# Patient Record
Sex: Male | Born: 1990 | Race: Black or African American | Hispanic: No | Marital: Single | Smoking: Current every day smoker
Health system: Southern US, Community
[De-identification: ages and names within clinical notes are randomized; demographics above are authoritative.]

---
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
## 2011-09-21 LAB — STREP A DNA PROBE

## 2011-09-21 LAB — RAPID STREP SCREEN (MED CTR MEBANE ONLY): Streptococcus, Group A Screen (Direct): NEGATIVE

## 2014-04-03 ENCOUNTER — Encounter (HOSPITAL_COMMUNITY): Payer: Self-pay | Admitting: Emergency Medicine

## 2014-04-03 ENCOUNTER — Emergency Department (HOSPITAL_COMMUNITY)
Admission: EM | Admit: 2014-04-03 | Discharge: 2014-04-03 | Disposition: A | Discharge status: Home / Self Care | Payer: No Typology Code available for payment source | Attending: Emergency Medicine | Admitting: Emergency Medicine

## 2014-04-03 ENCOUNTER — Emergency Department (HOSPITAL_COMMUNITY): Payer: No Typology Code available for payment source

## 2014-04-03 DIAGNOSIS — F17200 Nicotine dependence, unspecified, uncomplicated: Secondary | ICD-10-CM | POA: Insufficient documentation

## 2014-04-03 DIAGNOSIS — S0993XA Unspecified injury of face, initial encounter: Secondary | ICD-10-CM | POA: Insufficient documentation

## 2014-04-03 DIAGNOSIS — S199XXA Unspecified injury of neck, initial encounter: Secondary | ICD-10-CM

## 2014-04-03 DIAGNOSIS — Y9389 Activity, other specified: Secondary | ICD-10-CM | POA: Insufficient documentation

## 2014-04-03 DIAGNOSIS — Y92410 Unspecified street and highway as the place of occurrence of the external cause: Secondary | ICD-10-CM | POA: Insufficient documentation

## 2014-04-03 DIAGNOSIS — S0990XA Unspecified injury of head, initial encounter: Secondary | ICD-10-CM | POA: Insufficient documentation
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
## 2014-04-04 ENCOUNTER — Emergency Department (HOSPITAL_COMMUNITY)
Admission: EM | Admit: 2014-04-04 | Discharge: 2014-04-04 | Disposition: A | Discharge status: Home / Self Care | Payer: No Typology Code available for payment source | Attending: Emergency Medicine | Admitting: Emergency Medicine

## 2014-04-04 ENCOUNTER — Encounter (HOSPITAL_COMMUNITY): Payer: Self-pay | Admitting: Emergency Medicine

## 2014-04-04 DIAGNOSIS — G8911 Acute pain due to trauma: Secondary | ICD-10-CM | POA: Insufficient documentation

## 2014-04-04 DIAGNOSIS — F17200 Nicotine dependence, unspecified, uncomplicated: Secondary | ICD-10-CM | POA: Insufficient documentation

## 2014-04-04 DIAGNOSIS — M62838 Other muscle spasm: Secondary | ICD-10-CM | POA: Insufficient documentation

## 2014-04-04 MED ORDER — CYCLOBENZAPRINE HCL 10 MG PO TABS: 10.0000 mg | ORAL_TABLET | Freq: Two times a day (BID) | ORAL | Status: DC | PRN
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
## 2014-04-04 NOTE — Discharge Instructions (Signed)
Apply ice to the area, continue to take ibuprofen. Take the muscle relaxant as directed. Do not take the muscle relaxant if you are driving as it will make you sleepy. Return as needed.
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
## 2014-04-05 NOTE — ED Provider Notes (Signed)
Medical screening examination/treatment/procedure(s) were performed by non-physician practitioner and as supervising physician I was immediately available for consultation/collaboration.   EKG Interpretation None     Medical screening examination/treatment/procedure(s) were performed by non-physician practitioner and as supervising physician I was immediately available for consultation/collaboration.   EKG Interpretation None       Donnetta HutchingBrian Allye Hoyos, MD 04/05/14 2143

## 2016-03-05 ENCOUNTER — Encounter (HOSPITAL_COMMUNITY): Payer: Self-pay | Admitting: *Deleted

## 2016-03-05 ENCOUNTER — Emergency Department (HOSPITAL_COMMUNITY)
Admission: EM | Admit: 2016-03-05 | Discharge: 2016-03-05 | Disposition: A | Discharge status: Home / Self Care | Payer: No Typology Code available for payment source | Attending: Emergency Medicine | Admitting: Emergency Medicine

## 2016-03-05 DIAGNOSIS — R1013 Epigastric pain: Secondary | ICD-10-CM | POA: Insufficient documentation

## 2016-03-05 DIAGNOSIS — F17210 Nicotine dependence, cigarettes, uncomplicated: Secondary | ICD-10-CM | POA: Insufficient documentation

## 2016-03-05 DIAGNOSIS — R110 Nausea: Secondary | ICD-10-CM | POA: Insufficient documentation

## 2016-03-05 LAB — COMPREHENSIVE METABOLIC PANEL
ALK PHOS: 58 U/L (ref 38–126)
ALT: 19 U/L (ref 17–63)
ANION GAP: 9 (ref 5–15)
AST: 25 U/L (ref 15–41)
Albumin: 3.9 g/dL (ref 3.5–5.0)
BILIRUBIN TOTAL: 1.2 mg/dL (ref 0.3–1.2)
BUN: 17 mg/dL (ref 6–20)
CALCIUM: 9.2 mg/dL (ref 8.9–10.3)
CO2: 25 mmol/L (ref 22–32)
Chloride: 105 mmol/L (ref 101–111)
Creatinine, Ser: 1.29 mg/dL — ABNORMAL HIGH (ref 0.61–1.24)
GFR calc non Af Amer: >60 mL/min (ref >60)
Glucose, Bld: 111 mg/dL — ABNORMAL HIGH (ref 65–99)
Potassium: 3.8 mmol/L (ref 3.5–5.1)
SODIUM: 139 mmol/L (ref 135–145)
TOTAL PROTEIN: 7.2 g/dL (ref 6.5–8.1)

## 2016-03-05 LAB — CBC
HCT: 41.4 % (ref 39.0–52.0)
HEMOGLOBIN: 14.6 g/dL (ref 13.0–17.0)
MCH: 30.5 pg (ref 26.0–34.0)
MCHC: 35.3 g/dL (ref 30.0–36.0)
MCV: 86.6 fL (ref 78.0–100.0)
Platelets: 202 10*3/uL (ref 150–400)
RBC: 4.78 MIL/uL (ref 4.22–5.81)
RDW: 13.5 % (ref 11.5–15.5)
WBC: 3.7 10*3/uL — AB (ref 4.0–10.5)

## 2016-03-05 LAB — LIPASE, BLOOD: Lipase: 25 U/L (ref 11–51)

## 2016-03-05 MED ORDER — ONDANSETRON 4 MG PO TBDP
4.0000 mg | ORAL_TABLET | Freq: Once | ORAL | Status: AC
  Administered 2016-03-05: 4 mg via ORAL
  Filled 2016-03-05: qty 1

## 2016-03-05 NOTE — ED Notes (Signed)
Pt reports onset yesterday of mid abd pain and cramping. Having nausea, denies vomiting or diarrhea.

## 2016-03-05 NOTE — ED Provider Notes (Signed)
CSN: [id number]     Arrival date & time 03/05/16  0957 History   First MD Initiated Contact with Patient 03/05/16 1009     Chief Complaint  Patient presents with  . Abdominal Pain  . Nausea     (Consider location/radiation/quality/duration/timing/severity/associated sxs/prior Treatment) Patient is a 25 y.o. male presenting with abdominal pain. The history is provided by the patient.  Abdominal Pain Pain location:  Epigastric Pain quality: cramping   Pain radiates to:  Does not radiate Pain severity:  Moderate Onset quality:  Gradual Duration:  2 days Timing:  Constant Progression:  Unchanged Chronicity:  New Context: not suspicious food intake and not trauma   Relieved by:  Nothing Worsened by:  Nothing tried (eating does not make pain worse) Associated symptoms: nausea   Associated symptoms: no chest pain, no chills, no constipation, no diarrhea, no dysuria, no fever, no hematemesis, no hematuria, no shortness of breath, no sore throat, no vaginal bleeding, no vaginal discharge and no vomiting     History reviewed. No pertinent past medical history. History reviewed. No pertinent past surgical history. History reviewed. No pertinent family history. Social History  Substance Use Topics  . Smoking status: Current Every Day Smoker -- 0.50 packs/day    Types: Cigarettes  . Smokeless tobacco: None  . Alcohol Use: No    Review of Systems  Constitutional: Negative for fever, chills, diaphoresis, activity change and appetite change.  HENT: Negative for facial swelling, sore throat, tinnitus, trouble swallowing and voice change.   Eyes: Negative for pain, redness and visual disturbance.  Respiratory: Negative for chest tightness, shortness of breath and wheezing.   Cardiovascular: Negative for chest pain, palpitations and leg swelling.  Gastrointestinal: Positive for nausea and abdominal pain. Negative for vomiting, diarrhea, constipation, abdominal distention and hematemesis.   Endocrine: Negative.   Genitourinary: Negative.  Negative for dysuria, hematuria, decreased urine volume, scrotal swelling, vaginal bleeding, vaginal discharge and testicular pain.  Musculoskeletal: Negative for myalgias, back pain and gait problem.  Skin: Negative.  Negative for rash.  Neurological: Negative.  Negative for dizziness, tremors, weakness and headaches.  Psychiatric/Behavioral: Negative for suicidal ideas, hallucinations and self-injury. The patient is not nervous/anxious.       Allergies  Review of patient's allergies indicates no known allergies.  Home Medications   Prior to Admission medications   Medication Sig Start Date End Date Taking? Authorizing Provider  cyclobenzaprine (FLEXERIL) 10 MG tablet Take 1 tablet (10 mg total) by mouth 2 (two) times daily as needed for muscle spasms. 04/04/14   Hope Orlene Och, NP   BP 135/116 mmHg  Pulse 98  Temp(Src) 97.9 F (36.6 C) (Oral)  Resp 14  SpO2 98% Physical Exam  Constitutional: He is oriented to person, place, and time. He appears well-developed and well-nourished. No distress.  HENT:  Head: Normocephalic and atraumatic.  Right Ear: External ear normal.  Left Ear: External ear normal.  Nose: Nose normal.  Mouth/Throat: Oropharynx is clear and moist.  Eyes: Conjunctivae and EOM are normal. Pupils are equal, round, and reactive to light. No scleral icterus.  Neck: Normal range of motion. Neck supple. No JVD present. No tracheal deviation present. No thyromegaly present.  Cardiovascular: Normal rate and intact distal pulses.  Exam reveals no gallop and no friction rub.   No murmur heard. Pulmonary/Chest: Effort normal and breath sounds normal. No stridor. No respiratory distress. He has no wheezes. He has no rales.  Abdominal: Soft. He exhibits no distension. There is no tenderness (  no abdominal tenderness (pain level does not increase from baseline during palpation of abdomen)). There is no rebound and no guarding.   Musculoskeletal: Normal range of motion. He exhibits no edema or tenderness.  Neurological: He is alert and oriented to person, place, and time. No cranial nerve deficit. He exhibits normal muscle tone. Coordination normal.  5/5 strength in all 4 extremities. Normal Gait.   Skin: Skin is warm and dry. No rash noted. He is not diaphoretic.  Psychiatric: He has a normal mood and affect. His behavior is normal.  Nursing note and vitals reviewed.   ED Course  Procedures (including critical care time) Labs Review Labs Reviewed  COMPREHENSIVE METABOLIC PANEL - Abnormal; Notable for the following:    Glucose, Bld 111 (*)    Creatinine, Ser 1.29 (*)    All other components within normal limits  CBC - Abnormal; Notable for the following:    WBC 3.7 (*)    All other components within normal limits  LIPASE, BLOOD    Imaging Review No results found. I have personally reviewed and evaluated these images and lab results as part of my medical decision-making.   EKG Interpretation None      MDM   Final diagnoses:  Epigastric abdominal pain    The patient is a previous healthy 25 year old male who presents with 2 days of epigastric cramping abdominal pain. No fevers and patient's able tolerate by mouth at home. Physical exam benign as above. Laboratory evaluation shows mildly elevated creatinine concerning for pre-renal AKI but is otherwise unremarkable. Do not suspect pancreatitis, abdominal abscess, peritonitis or systemic infection. This patient is able tolerate by mouth feel patient is able to rehydrate orally. Patient advised to drink plenty of hydrating fluids and follow-up with primary care doctor. Standard ED return precautions given. Patient expresses understanding and agreement with this plan.  I estimate there is LOW risk for ACUTE APPENDICITIS, BOWEL OBSTRUCTION, CHOLECYSTITIS, DIVERTICULITIS, INCARCERATED HERNIA, MESENTERIC ISCHEMIA, PANCREATITIS, or PERFORATED BOWEL or ULCER,  thus I consider the discharge disposition reasonable. Also, there is no evidence or peritonitis, sepsis or toxicity. We have discussed the diagnosis and risks and agree with discharging home to follow-up with their primary doctor. We also discussed returning to the Emergency Department immediately if new or worsening symptoms occur and have discussed the symptoms which are most concerning (e.g., bloody stool, fever, changing or worsening pain, vomiting) that necessitate immediate return.  Patient seen with attending, Dr. Lynelle DoctorKnapp, who oversaw clinical decision making.     Lula OlszewskiMike Iris Hairston, MD 03/05/16 1117  Linwood DibblesJon Knapp, MD 03/05/16 [phone]

## 2016-03-05 NOTE — Discharge Instructions (Signed)

## 2016-10-17 ENCOUNTER — Encounter (HOSPITAL_COMMUNITY): Payer: Self-pay | Admitting: Emergency Medicine

## 2016-10-17 ENCOUNTER — Emergency Department (HOSPITAL_COMMUNITY)
Admission: EM | Admit: 2016-10-17 | Discharge: 2016-10-17 | Disposition: A | Discharge status: Home / Self Care | Payer: Self-pay | Attending: Emergency Medicine | Admitting: Emergency Medicine

## 2016-10-17 DIAGNOSIS — F17210 Nicotine dependence, cigarettes, uncomplicated: Secondary | ICD-10-CM | POA: Insufficient documentation

## 2016-10-17 DIAGNOSIS — R42 Dizziness and giddiness: Secondary | ICD-10-CM | POA: Insufficient documentation

## 2016-10-17 DIAGNOSIS — J069 Acute upper respiratory infection, unspecified: Secondary | ICD-10-CM | POA: Insufficient documentation

## 2016-10-17 NOTE — ED Triage Notes (Signed)
Pt to ER by private vehicle with complaint of intermittent dizziness onset after being sick with URI for 2 days. Pt reports he had minimal appetite while sick. Pt reports worse with standing and walking. VSS at this time. A/o x4.

## 2016-10-17 NOTE — ED Notes (Signed)
Pt refused wheelchair at discharge. NAD. Ambulatory without dizziness at this time. Steady gait. Verbalizes understanding of discharge instructions.

## 2016-10-17 NOTE — ED Provider Notes (Signed)
[facility]-EMERGENCY DEPT Provider Note   CSN: [id number] Arrival date & time: 10/17/16  [id number]     History   Chief Complaint Chief Complaint  Patient presents with  . Dizziness    HPI Chris Gibson is a 25 y.o. male.  HPI Patient reports he had cold symptoms on Monday. He had a lot of nasal congestion and body aches. He thinks he had a fever that day. He continued however to feel dizzy if he turned his head quickly or bends over. 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 No nausea, vomiting or diarrhea. No skin rashes. No headache. History reviewed. No pertinent past medical history.  There are no active problems to display for this patient.   History reviewed. No pertinent surgical history.     Home Medications    Prior to Admission medications   Medication Sig Start Date End Date Taking? Authorizing Provider  cyclobenzaprine (FLEXERIL) 10 MG tablet Take 1 tablet (10 mg total) by mouth 2 (two) times daily as needed for muscle spasms. 04/04/14   Hope Orlene OchM Neese, NP    Family History History reviewed. No pertinent family history.  Social History Social History  Substance Use Topics  . Smoking status: Current Every Day Smoker    Packs/day: 0.50    Types: Cigarettes  . Smokeless tobacco: Never Used  . Alcohol use No     Allergies   Patient has no known allergies.   Review of Systems Review of Systems 10 Systems reviewed and are negative for acute change except as noted in the HPI.  Physical Exam Updated Vital Signs BP 111/78   Pulse 72   Temp 97.5 F (36.4 C) (Oral)   Resp 19   SpO2 99%   Physical Exam  Constitutional: He is oriented to person, place, and time. He appears well-developed and well-nourished.  HENT:  Head: Normocephalic and atraumatic.  Nose: Nose normal.  Mouth/Throat: Oropharynx is clear and moist.  Cerumen  bilateral ear canals. Partial visualization of TMs does not show erythema or purulent appearance.  Eyes: Conjunctivae and EOM are normal. Pupils are equal, round, and reactive to light.  Neck: Neck supple.  Cardiovascular: Normal rate and regular rhythm.   No murmur heard. Pulmonary/Chest: Effort normal and breath sounds normal. No respiratory distress.  Abdominal: Soft. There is no tenderness.  Musculoskeletal: He exhibits no edema or tenderness.  Neurological: He is alert and oriented to person, place, and time. No cranial nerve deficit. He exhibits normal muscle tone. Coordination normal.  Skin: Skin is warm and dry.  Psychiatric: He has a normal mood and affect.  Nursing note and vitals reviewed.    ED Treatments / Results  Labs (all labs ordered are listed, but only abnormal results are displayed) Labs Reviewed - No data to display  EKG  EKG Interpretation  Date/Time:  Wednesday October 17 2016 08:43:55 EST Ventricular Rate:  71 PR Interval:    QRS Duration: 96 QT Interval:  369 QTC Calculation: 401 R Axis:   99 Text Interpretation:  Sinus rhythm Confirmed by Advanced Surgery Center Of Clif[facility] LLCALUMBO-RASCH  MD, APRIL ([id number]) on 10/18/2016 10:11:02 AM       Radiology No results found.  Procedures Procedures (including critical care time)  Medications Ordered in ED Medications - No data to display   Initial Impression / Assessment and Plan / ED Course  I have  reviewed the triage vital signs and the nursing notes.  Pertinent labs & imaging results that were available during my care of the patient were reviewed by me and considered in my medical decision making (see chart for details).  Clinical Course       Final Clinical Impressions(s) / ED Diagnoses   Final diagnoses:  Dizziness  Upper respiratory tract infection, unspecified type   Patient clinically well in appearance. Plan for symptomatic treatment for URI symptoms. Return precautions reviewed. New Prescriptions Discharge  Medication List as of 10/17/2016  9:12 AM       Arby BarretteMarcy Oyinkansola Truax, MD 10/23/16 1427

## 2016-10-17 NOTE — Discharge Instructions (Signed)
You may take an over- the-counter product such as Tylenol Severe cold and sinus medication.

## 2016-10-17 NOTE — ED Notes (Signed)
Brought patient back to room; Onalee Huaavid, EMT present in room; patient undressed, in gown, on monitor, continuous pulse oximetry and blood pressure cuff; vitals and EKG performed

## 2017-08-09 ENCOUNTER — Encounter (HOSPITAL_COMMUNITY): Payer: Self-pay | Admitting: *Deleted

## 2017-08-09 ENCOUNTER — Emergency Department (HOSPITAL_COMMUNITY)
Admission: EM | Admit: 2017-08-09 | Discharge: 2017-08-09 | Disposition: A | Discharge status: Home / Self Care | Payer: Self-pay | Attending: Emergency Medicine | Admitting: Emergency Medicine

## 2017-08-09 DIAGNOSIS — R69 Illness, unspecified: Secondary | ICD-10-CM

## 2017-08-09 DIAGNOSIS — J039 Acute tonsillitis, unspecified: Secondary | ICD-10-CM | POA: Insufficient documentation

## 2017-08-09 DIAGNOSIS — J111 Influenza due to unidentified influenza virus with other respiratory manifestations: Secondary | ICD-10-CM

## 2017-08-09 DIAGNOSIS — F17210 Nicotine dependence, cigarettes, uncomplicated: Secondary | ICD-10-CM | POA: Insufficient documentation

## 2017-08-09 DIAGNOSIS — M545 Low back pain: Secondary | ICD-10-CM | POA: Insufficient documentation

## 2017-08-09 LAB — RAPID STREP SCREEN (MED CTR MEBANE ONLY): Streptococcus, Group A Screen (Direct): NEGATIVE

## 2017-08-09 MED ORDER — IBUPROFEN 800 MG PO TABS: 800.0000 mg | ORAL_TABLET | Freq: Three times a day (TID) | ORAL | 0 refills | Status: DC

## 2017-08-09 MED ORDER — TRAMADOL HCL 50 MG PO TABS
50.0000 mg | ORAL_TABLET | Freq: Once | ORAL | Status: AC
  Administered 2017-08-09: 50 mg via ORAL
  Filled 2017-08-09: qty 1

## 2017-08-09 MED ORDER — ACETAMINOPHEN 325 MG PO TABS
650.0000 mg | ORAL_TABLET | Freq: Once | ORAL | Status: AC | PRN
  Administered 2017-08-09: 650 mg via ORAL

## 2017-08-09 MED ORDER — AMOXICILLIN 500 MG PO CAPS: 500.0000 mg | ORAL_CAPSULE | Freq: Three times a day (TID) | ORAL | 0 refills | Status: DC

## 2017-08-09 MED ORDER — ACETAMINOPHEN 325 MG PO TABS
ORAL_TABLET | ORAL | Status: AC
  Filled 2017-08-09: qty 2

## 2017-08-09 NOTE — ED Triage Notes (Signed)
Pt reports that he has the flu. Reports bodyaches, chills, headache, sore throat, nausea since Tuesday. Mask on pt at triage.

## 2017-08-09 NOTE — ED Provider Notes (Signed)
[facility]PT Provider Note   CSN: [id number] Arrival date & time: 08/09/17  [id number]  History   Chief Complaint Chief Complaint  Patient presents with  . Influenza    HPI Chris Gibson is a 26 y.o. male.  HPI   Patient comes to the ER with complaints of URI symptoms since this past Tuesday. He has been having fevers, low back pain, body aches, sore throat, headache and nausea. He has not tried taking any medications at home prior to arrival. He has had strep throat before and this feels similar. He has not had any rash, dysuria, coughing, syncope, severe headaches, abdominal pain, weight loss or abnormal bleeding. Temperature 102.8 on arrival, given protocol tylenol  History reviewed. No pertinent past medical history.  There are no active problems to display for this patient.   History reviewed. No pertinent surgical history.     Home Medications    Prior to Admission medications   Medication Sig Start Date End Date Taking? Authorizing Provider  cyclobenzaprine (FLEXERIL) 10 MG tablet Take 1 tablet (10 mg total) by mouth 2 (two) times daily as needed for muscle spasms. Patient not taking: Reported on 08/09/2017 04/04/14   Janne NapoleonNeese, Hope M, NP    Family History History reviewed. No pertinent family history.  Social History Social History  Substance Use Topics  . Smoking status: Current Every Day Smoker    Packs/day: 0.50    Types: Cigarettes  . Smokeless tobacco: Never Used  . Alcohol use No     Allergies   Patient has no known allergies.   Review of Systems Review of Systems ROS: No TIA's or unusual headaches, no dysphagia.  No prolonged cough. No dyspnea or chest pain on exertion.  No abdominal pain, change in bowel habits, black or bloody stools.  No urinary tract symptoms.  No new or unusual musculoskeletal symptoms.  Normal menses, no abnormal vaginal bleeding, discharge or unexpected pelvic pain. No new breast lumps, breast pain or nipple  discharge.  Physical Exam Updated Vital Signs BP 128/79   Pulse 95   Temp 99.2 F (37.3 C) (Oral)   Resp 18   SpO2 97%   Physical Exam  Constitutional: He is oriented to person, place, and time. He appears well-developed and well-nourished. No distress.  HENT:  Head: Normocephalic and atraumatic.  Right Ear: Tympanic membrane, external ear and ear canal normal.  Left Ear: Tympanic membrane, external ear and ear canal normal.  Nose: Nose normal. No rhinorrhea. Right sinus exhibits no maxillary sinus tenderness and no frontal sinus tenderness. Left sinus exhibits no maxillary sinus tenderness and no frontal sinus tenderness.  Mouth/Throat: Uvula is midline and mucous membranes are normal. No trismus in the jaw. Normal dentition. No dental abscesses or uvula swelling. Oropharyngeal exudate and posterior oropharyngeal edema present. No posterior oropharyngeal erythema or tonsillar abscesses.  No submental edema, tongue not elevated, no trismus. No impending airway obstruction; Pt able to speak full sentences, swallow intact, no drooling, stridor, or tonsillar/uvula displacement. No palatal petechia  Eyes: Conjunctivae are normal.  Neck: Trachea normal, normal range of motion and full passive range of motion without pain. Neck supple. No neck rigidity. Normal range of motion present. No Brudzinski's sign noted.  Flexion and extension of neck without pain or difficulty. Able to breath without difficulty in extension.  Cardiovascular: Normal rate and regular rhythm.   Pulmonary/Chest: Effort normal and breath sounds normal. No stridor. No respiratory distress. He has no wheezes.  Abdominal: Soft. There is  no tenderness.  No obvious evidence of splenomegaly. Non ttp.   Musculoskeletal: Normal range of motion.  Lymphadenopathy:       Head (right side): No preauricular and no posterior auricular adenopathy present.       Head (left side): No preauricular and no posterior auricular adenopathy  present.    He has cervical adenopathy.  Neurological: He is alert and oriented to person, place, and time.  Skin: Skin is warm and dry. No rash noted. He is not diaphoretic.  Psychiatric: He has a normal mood and affect.  Nursing note and vitals reviewed.    ED Treatments / Results  Labs (all labs ordered are listed, but only abnormal results are displayed) Labs Reviewed  RAPID STREP SCREEN (NOT AT [facility])  CULTURE, GROUP A STREP [facility])    EKG  EKG Interpretation None       Radiology No results found.  Procedures Procedures (including critical care time)  Medications Ordered in ED Medications  acetaminophen (TYLENOL) 325 MG tablet (not administered)  acetaminophen (TYLENOL) tablet 650 mg (650 mg Oral Given 08/09/17 0736)  traMADol (ULTRAM) tablet 50 mg (50 mg Oral Given 08/09/17 1016)     Initial Impression / Assessment and Plan / ED Course  I have reviewed the triage vital signs and the nursing notes.  Pertinent labs & imaging results that were available during my care of the patient were reviewed by me and considered in my medical decision making (see chart for details).    Strep screen is negative but [name] has tonsillar exudates and erythema to his posterior pharynx. This is most likely viral. Will give Rx for Penicillin and I ask that he wait at least 3 more days before taking it to see if his symptoms improve or he can take it if his symptoms worsen. Ibuprofen is sufficient for his back pain.  He otherwise has not apparent life threatening conditions at this time. He has been given s/sx that would need a repeat examination. Such as new, changing or worsening of his symptoms. Or if his symptoms fail to resolve.  Final Clinical Impressions(s) / ED Diagnoses   Final diagnoses:  Influenza-like illness  Tonsillitis    New Prescriptions New Prescriptions   No medications on file     Marlon Pel, Cordelia Poche 08/09/17 1045    Alvira Monday, MD 08/13/17  0025

## 2017-08-11 LAB — CULTURE, GROUP A STREP (THRC)

## 2017-08-12 ENCOUNTER — Emergency Department (HOSPITAL_COMMUNITY)
Admission: EM | Admit: 2017-08-12 | Discharge: 2017-08-12 | Disposition: A | Discharge status: Home / Self Care | Payer: Self-pay | Attending: Emergency Medicine | Admitting: Emergency Medicine

## 2017-08-12 ENCOUNTER — Encounter (HOSPITAL_COMMUNITY): Payer: Self-pay

## 2017-08-12 DIAGNOSIS — F17210 Nicotine dependence, cigarettes, uncomplicated: Secondary | ICD-10-CM | POA: Insufficient documentation

## 2017-08-12 DIAGNOSIS — J029 Acute pharyngitis, unspecified: Secondary | ICD-10-CM | POA: Insufficient documentation

## 2017-08-12 DIAGNOSIS — R21 Rash and other nonspecific skin eruption: Secondary | ICD-10-CM | POA: Insufficient documentation

## 2017-08-12 MED ORDER — GI COCKTAIL ~~LOC~~
30.0000 mL | Freq: Once | ORAL | Status: AC
  Administered 2017-08-12: 30 mL via ORAL
  Filled 2017-08-12: qty 30

## 2017-08-12 MED ORDER — DEXAMETHASONE SODIUM PHOSPHATE 10 MG/ML IJ SOLN
10.0000 mg | Freq: Once | INTRAMUSCULAR | Status: AC
Start: 2017-08-12 — End: 2017-08-12
  Administered 2017-08-12: 10 mg via INTRAMUSCULAR
  Filled 2017-08-12: qty 1

## 2017-08-12 MED ORDER — KETOROLAC TROMETHAMINE 30 MG/ML IJ SOLN
30.0000 mg | Freq: Once | INTRAMUSCULAR | Status: DC
  Filled 2017-08-12: qty 1

## 2017-08-12 MED ORDER — LIDOCAINE VISCOUS 2 % MT SOLN: 20.0000 mL | OROMUCOSAL | 0 refills | Status: DC | PRN

## 2017-08-12 NOTE — ED Triage Notes (Signed)
Per Pt, Pt is coming from home with complaints of macular rash around neck that started on Saturday after starting antibiotics. Pt also complains of sputum in throat that has a red tint. Reports drinking red drink yesterday, but the red tint has continued on to today.

## 2017-08-12 NOTE — Discharge Instructions (Signed)
Doubt that the rash is due to a medication reaction. Seems consistent with possible bug bite. Warm compresses to the spot. Take benadryl to help with swelling.   Please continue taking motrin and tylenol for your sore throat. Have given you a script for viscous lidocaine. Take to help with sore throat. Would continue taking the amoxicillin. If you continue see red sputum you may follow up with your pcp or return to the ED.   If you are sore throat is not improved within the next week follow-up with the primary care doctor for further evaluation. If he developed worsening symptoms or difficulty breathing or swallowing return to the ED.

## 2017-08-12 NOTE — ED Provider Notes (Signed)
[facility]-EMERGENCY DEPT Provider Note   CSN: [id number] Arrival date & time: 08/12/17  0827     History   Chief Complaint Chief Complaint  Patient presents with  . Rash    HPI Chris Gibson is a 26 y.o. male.  HPI 26 year old African-American male with no significant past medical history presents to the ED today with possible medication reaction. The patient states that 3 days ago he was started on amoxicillin in the ED for fever, sore throat. Patient states that today he noticed 2 red bumps come up on his anterior chest. They're painful to palpation. He denies being bit by any insects.patient denies any history of antibiotic reactions. States that his fevers have improved.the patient states the rash is not pruritic in nature. Denies any oral mucosal involvement.  The patient states that his sore throat is ongoing. States that he has been taking ibuprofen and Tylenol at home with little relief. The patient states that after drinking a red drink yesterday he noticed some red streaking in his sputum. Denies any frank hemoptysis or hematocrit emesis. States that it feels like he scratched something the back of his throat. Patient denies any associated symptoms of shortness of breath, cough, nausea, vomiting, abdominal pain. Denies any otalgia, rhinorrhea, chest congestion. Denies any cough. History reviewed. No pertinent past medical history.  There are no active problems to display for this patient.   History reviewed. No pertinent surgical history.     Home Medications    Prior to Admission medications   Medication Sig Start Date End Date Taking? Authorizing Provider  amoxicillin (AMOXIL) 500 MG capsule Take 1 capsule (500 mg total) by mouth 3 (three) times daily. 08/09/17  Yes Neva SeatGreene, Tiffany, PA-C  ibuprofen (ADVIL,MOTRIN) 800 MG tablet Take 1 tablet (800 mg total) by mouth 3 (three) times daily. 08/09/17  Yes Neva SeatGreene, Tiffany, PA-C  cyclobenzaprine (FLEXERIL) 10 MG tablet Take 1  tablet (10 mg total) by mouth 2 (two) times daily as needed for muscle spasms. Patient not taking: Reported on 08/09/2017 04/04/14   Janne NapoleonNeese, Hope M, NP  lidocaine (XYLOCAINE) 2 % solution Use as directed 20 mLs in the mouth or throat as needed for mouth pain. 08/12/17   Rise MuLeaphart, Arnetta Odeh T, PA-C    Family History No family history on file.  Social History Social History  Substance Use Topics  . Smoking status: Current Every Day Smoker    Packs/day: 1.00    Types: Cigarettes  . Smokeless tobacco: Never Used  . Alcohol use Yes     Comment: Occasional      Allergies   Patient has no known allergies.   Review of Systems Review of Systems  Constitutional: Negative for chills and fever.  HENT: Positive for sore throat. Negative for congestion and rhinorrhea.   Respiratory: Negative for cough and shortness of breath.   Cardiovascular: Negative for chest pain.  Gastrointestinal: Negative for nausea and vomiting.  Skin: Positive for rash. Negative for color change.  Neurological: Negative for dizziness, syncope, weakness and light-headedness.     Physical Exam Updated Vital Signs BP 121/73 (BP Location: Right Arm)   Pulse 67   Temp 98.2 F (36.8 C) (Oral)   Resp 18   Ht 5\' 5"  (1.651 m)   Wt 65.8 kg (145 lb)   SpO2 100%   BMI 24.13 kg/m   Physical Exam  Constitutional: He appears well-developed and well-nourished. No distress.  HENT:  Head: Normocephalic and atraumatic.  Mouth/Throat: Uvula is midline and mucous  membranes are normal. No trismus in the jaw. No uvula swelling. Oropharyngeal exudate, posterior oropharyngeal edema and posterior oropharyngeal erythema present. No tonsillar abscesses. Tonsils are 2+ on the right. Tonsils are 2+ on the left. Tonsillar exudate.  o oral mucosal involvement or lesions. Oropharynx is with 2+ tonsillar edema and exudate.  Eyes: Right eye exhibits no discharge. Left eye exhibits no discharge. No scleral icterus.  Neck: Normal range of  motion. Neck supple.  Pulmonary/Chest: Effort normal and breath sounds normal. No respiratory distress.  Musculoskeletal: Normal range of motion.  Lymphadenopathy:    He has no cervical adenopathy.  Neurological: He is alert.  Skin: Skin is warm and dry. Capillary refill takes less than 2 seconds. No pallor.  Patient has 2 erythematous lesions to the anterior chest. Appears to be a possible bug bite. They're not pruritic. Mild tenderness to palpation. No area of fluctuance concerning for abscess. No spreading cellulitis. No other rash noted.  Psychiatric: His behavior is normal. Judgment and thought content normal.  Nursing note and vitals reviewed.    ED Treatments / Results  Labs (all labs ordered are listed, but only abnormal results are displayed) Labs Reviewed - No data to display  EKG  EKG Interpretation None       Radiology No results found.  Procedures Procedures (including critical care time)  Medications Ordered in ED Medications  gi cocktail (Maalox,Lidocaine,Donnatal) (30 mLs Oral Given 08/12/17 0906)  dexamethasone (DECADRON) injection 10 mg (10 mg Intramuscular Given 08/12/17 0907)     Initial Impression / Assessment and Plan / ED Course  I have reviewed the triage vital signs and the nursing notes.  Pertinent labs & imaging results that were available during my care of the patient were reviewed by me and considered in my medical decision making (see chart for details).     Patient presents to the ED with ongoing sore throat and possible  Rash due to antibiotic use. The erythematous lesions on the anterior chest do not seem consistent with a possible allergic reaction to the amoxicillin.Patient has no signs of petechiae. Oropharynx is with exudates and edema. Patient given Decadron. Uvula is midline with no trismus. Doubt deep neck infection or peritonsillar abscess. Would like patient to continue taking the amoxicillin for pharyngitis. The patient's pain and  shoulder in the ED. Patient is tolerating by mouth fluids without any difficulties. Doubt GC pharyngitis.the erythematous lesions seem consistent with possible bug bite. Encouraged Benadryl and warm compresses. No signs of drainable abscess or cellulitis at this time. Vital signs are reassuring. Patient is afebrile.  Patient reports some red streaking in his sputum. Likely from the red drink that he drank yesterday. Clinical presentation does not seem concerning for PE. Encouraged strict return precautions and follow up PCP.  Pt is hemodynamically stable, in NAD, & able to ambulate in the ED. Evaluation does not show pathology that would require ongoing emergent intervention or inpatient treatment. I explained the diagnosis to the patient. Pain has been managed & has no complaints prior to dc. Pt is comfortable with above plan and is stable for discharge at this time. All questions were answered prior to disposition. Strict return precautions for f/u to the ED were discussed. Encouraged follow up with PCP.  Dicussed with Dr. Madilyn Hook who saw patient and is agreeable to the above [plan.   Final Clinical Impressions(s) / ED Diagnoses   Final diagnoses:  Rash  Sorethroat    New Prescriptions Discharge Medication List as of 08/12/2017 10:07  AM    START taking these medications   Details  lidocaine (XYLOCAINE) 2 % solution Use as directed 20 mLs in the mouth or throat as needed for mouth pain., Starting Mon 08/12/2017, Print         Rise Mu, PA-C 08/12/17 1548    Tilden Fossa, MD 08/17/17 [phone]

## 2017-10-06 ENCOUNTER — Emergency Department (HOSPITAL_COMMUNITY)
Admission: EM | Admit: 2017-10-06 | Discharge: 2017-10-06 | Disposition: A | Discharge status: Home / Self Care | Payer: Self-pay | Attending: Emergency Medicine | Admitting: Emergency Medicine

## 2017-10-06 ENCOUNTER — Encounter (HOSPITAL_COMMUNITY): Payer: Self-pay | Admitting: Emergency Medicine

## 2017-10-06 DIAGNOSIS — Z791 Long term (current) use of non-steroidal anti-inflammatories (NSAID): Secondary | ICD-10-CM | POA: Insufficient documentation

## 2017-10-06 DIAGNOSIS — F17210 Nicotine dependence, cigarettes, uncomplicated: Secondary | ICD-10-CM | POA: Insufficient documentation

## 2017-10-06 DIAGNOSIS — L02415 Cutaneous abscess of right lower limb: Secondary | ICD-10-CM | POA: Insufficient documentation

## 2017-10-06 MED ORDER — LIDOCAINE-EPINEPHRINE (PF) 2 %-1:200000 IJ SOLN
10.0000 mL | Freq: Once | INTRAMUSCULAR | Status: AC
  Administered 2017-10-06: 10 mL via INTRADERMAL
  Filled 2017-10-06: qty 20

## 2017-10-06 NOTE — ED Triage Notes (Signed)
Pt c/o boil to right thigh area x couple days

## 2017-10-06 NOTE — Discharge Instructions (Signed)
Keep wound clean and dry. Apply warm compresses hroughout the day. Alternate 600 mg of ibuprofen and [phone] mg of Tylenol every 3 hours as needed for pain. Do not exceed 4000 mg of Tylenol daily. Return in 2 days for wound recheck.Return to emergency department for emergent changing or worsening symptoms.

## 2017-10-06 NOTE — ED Notes (Signed)
Declined W/C at D/C and was escorted to lobby by RN. 

## 2017-10-06 NOTE — ED Provider Notes (Signed)
[facility] Provider Note   CSN: [id number] Arrival date & time: 10/06/17  1708     History   Chief Complaint Chief Complaint  Patient presents with  . Abscess    HPI Chris Gibson is a 26 y.o. male who presents today with chief complaint acute onset, progressively worsening swelling and erythema to the right lateral hip for one week. Patient states the area initially began as a small bump which progressively grew in size and he has become more tender.states that he has had abscesses like this in the past but they typically resolve on their own. States that he had attempted to "pop" the lesion on his own  and states it drained a small amount of purulent material. Denies fever or chills.no aggravating or alleviating factors noted.  The history is provided by the patient.    History reviewed. No pertinent past medical history.  There are no active problems to display for this patient.   History reviewed. No pertinent surgical history.     Home Medications    Prior to Admission medications   Medication Sig Start Date End Date Taking? Authorizing Provider  amoxicillin (AMOXIL) 500 MG capsule Take 1 capsule (500 mg total) by mouth 3 (three) times daily. 08/09/17   Marlon PelGreene, Tiffany, PA-C  cyclobenzaprine (FLEXERIL) 10 MG tablet Take 1 tablet (10 mg total) by mouth 2 (two) times daily as needed for muscle spasms. Patient not taking: Reported on 08/09/2017 04/04/14   Janne NapoleonNeese, Hope M, NP  ibuprofen (ADVIL,MOTRIN) 800 MG tablet Take 1 tablet (800 mg total) by mouth 3 (three) times daily. 08/09/17   Marlon PelGreene, Tiffany, PA-C  lidocaine (XYLOCAINE) 2 % solution Use as directed 20 mLs in the mouth or throat as needed for mouth pain. 08/12/17   Rise MuLeaphart, Kenneth T, PA-C    Family History No family history on file.  Social History Social History  Substance Use Topics  . Smoking status: Current Every Day Smoker    Packs/day: 1.00    Types: Cigarettes    . Smokeless tobacco: Never Used  . Alcohol use Yes     Comment: Occasional      Allergies   Patient has no known allergies.   Review of Systems Review of Systems  Constitutional: Negative for chills and fever.  Skin: Positive for color change.     Physical Exam Updated Vital Signs BP (!) 122/91   Pulse 80   Temp 98.5 F (36.9 C) (Oral)   Resp 18   Ht 5\' 5"  (1.651 m)   Wt 61.2 kg (135 lb)   SpO2 98%   BMI 22.47 kg/m   Physical Exam  Constitutional: He appears well-developed and well-nourished. No distress.  HENT:  Head: Normocephalic and atraumatic.  Eyes: Conjunctivae are normal. Right eye exhibits no discharge. Left eye exhibits no discharge.  Neck: No JVD present. No tracheal deviation present.  Cardiovascular: Normal rate.   Pulmonary/Chest: Effort normal.  Abdominal: He exhibits no distension.  Musculoskeletal: He exhibits no edema.  Neurological: He is alert.  Skin: Skin is warm and dry. There is erythema.  3 cm area of fluctuance and tenderness to the right lateral hip overlying the ASIS. Mildly tender to palpation. There is scabbing centrally from where the patient attempted to extract pus. Here is mild erythema overlying this area.  Psychiatric: He has a normal mood and affect. His behavior is normal.  Nursing note and vitals reviewed.    ED Treatments / Results  Labs (all labs ordered are listed, but only abnormal results are displayed) Labs Reviewed - No data to display  EKG  EKG Interpretation None       Radiology No results found.  Procedures .Marland KitchenIncision and Drainage Date/Time: 10/06/2017 5:52 PM Performed by: Michela Pitcher A Authorized by: Michela Pitcher A   Consent:    Consent obtained:  Verbal   Consent given by:  Patient   Risks discussed:  Bleeding, pain, incomplete drainage and infection Location:    Type:  Abscess   Size:  3cm   Location:  Lower extremity   Lower extremity location:  Hip   Hip location:  R hip Pre-procedure  details:    Skin preparation:  Betadine Anesthesia (see MAR for exact dosages):    Anesthesia method:  Local infiltration   Local anesthetic:  Lidocaine 2% WITH epi Procedure type:    Complexity:  Simple Procedure details:    Needle aspiration: no     Incision types:  Stab incision   Incision depth:  Subcutaneous   Scalpel blade:  11   Wound management:  Probed and deloculated and irrigated with saline   Drainage:  Purulent   Drainage amount:  Moderate   Wound treatment:  Wound left open Post-procedure details:    Patient tolerance of procedure:  Tolerated well, no immediate complications   (including critical care time)  Medications Ordered in ED Medications  lidocaine-EPINEPHrine (XYLOCAINE W/EPI) 2 %-1:200000 (PF) injection 10 mL (10 mLs Intradermal Given 10/06/17 1735)     Initial Impression / Assessment and Plan / ED Course  I have reviewed the triage vital signs and the nursing notes.  Pertinent labs & imaging results that were available during my care of the patient were reviewed by me and considered in my medical decision making (see chart for details).     Patient with skin abscess amenable to incision and drainage. Afebrile, vital signs are stable. Abscess was not large enough to warrant packing or drain,  wound recheck in 2 days. Encouraged home warm soaks and flushing.  Mild signs of cellulitis surrounding skin. No antibiotic therapy is indicated.discussed indications for return to the ED sooner. Pt verbalized understanding of and agreement with plan and is safe for discharge home at this time.   Final Clinical Impressions(s) / ED Diagnoses   Final diagnoses:  Abscess of hip, right    New Prescriptions New Prescriptions   No medications on file     Bennye Alm 10/06/17 1755    Mancel Bale, MD 10/06/17 2023

## 2017-10-08 ENCOUNTER — Emergency Department (HOSPITAL_COMMUNITY)
Admission: EM | Admit: 2017-10-08 | Discharge: 2017-10-08 | Disposition: A | Discharge status: Home / Self Care | Payer: Self-pay | Attending: Emergency Medicine | Admitting: Emergency Medicine

## 2017-10-08 ENCOUNTER — Encounter (HOSPITAL_COMMUNITY): Payer: Self-pay

## 2017-10-08 DIAGNOSIS — F17210 Nicotine dependence, cigarettes, uncomplicated: Secondary | ICD-10-CM | POA: Insufficient documentation

## 2017-10-08 DIAGNOSIS — Z5189 Encounter for other specified aftercare: Secondary | ICD-10-CM | POA: Insufficient documentation

## 2017-10-08 NOTE — ED Triage Notes (Signed)
Patient here to have abscess rechecked as directed, no complaints

## 2017-10-08 NOTE — Discharge Instructions (Signed)
Please read attached information. If you experience any new or worsening signs or symptoms please return to the emergency room for evaluation. Please follow-up with your primary care provider or specialist as discussed.  °

## 2017-10-08 NOTE — ED Notes (Signed)
Patient given discharge instructions and verbalized understanding.  Patient stable to discharge at this time.  Patient is alert and oriented to baseline.  No distressed noted at this time.  All belongings taken with the patient at discharge.   

## 2017-10-08 NOTE — ED Provider Notes (Signed)
[facility] EMERGENCY DEPARTMENT Provider Note   CSN: [id number] Arrival date & time: 10/08/17  1100     History   Chief Complaint Chief Complaint  Patient presents with  . Recurrent Skin Infections    HPI Chris Gibson is a 26 y.o. male.  HPI   26 year old male presents today for wound check.  Patient notes he was seen 2 days ago for an abscess to his left upper thigh.  He had I&D, no packing, no antibiotics.  He notes since then he has been taking showers, wound has been getting smaller with no surrounding redness, denies any fever, no other complaints.  History reviewed. No pertinent past medical history.  There are no active problems to display for this patient.   History reviewed. No pertinent surgical history.     Home Medications    Prior to Admission medications   Medication Sig Start Date End Date Taking? Authorizing Provider  amoxicillin (AMOXIL) 500 MG capsule Take 1 capsule (500 mg total) by mouth 3 (three) times daily. 08/09/17   Marlon Pel, PA-C  cyclobenzaprine (FLEXERIL) 10 MG tablet Take 1 tablet (10 mg total) by mouth 2 (two) times daily as needed for muscle spasms. Patient not taking: Reported on 08/09/2017 04/04/14   Janne Napoleon, NP  ibuprofen (ADVIL,MOTRIN) 800 MG tablet Take 1 tablet (800 mg total) by mouth 3 (three) times daily. 08/09/17   Marlon Pel, PA-C  lidocaine (XYLOCAINE) 2 % solution Use as directed 20 mLs in the mouth or throat as needed for mouth pain. 08/12/17   Rise Mu, PA-C    Family History No family history on file.  Social History Social History  Substance Use Topics  . Smoking status: Current Every Day Smoker    Packs/day: 1.00    Types: Cigarettes  . Smokeless tobacco: Never Used  . Alcohol use Yes     Comment: Occasional      Allergies   Patient has no known allergies.   Review of Systems Review of Systems  All other systems reviewed and are negative.    Physical  Exam Updated Vital Signs BP 132/74 (BP Location: Right Arm)   Pulse 78   Temp 97.9 F (36.6 C) (Oral)   Resp 18   Ht 5\' 5"  (1.651 m)   Wt 61.2 kg (135 lb)   SpO2 100%   BMI 22.47 kg/m   Physical Exam  Constitutional: He is oriented to person, place, and time. He appears well-developed and well-nourished.  HENT:  Head: Normocephalic and atraumatic.  Eyes: Pupils are equal, round, and reactive to light. Conjunctivae are normal. Right eye exhibits no discharge. Left eye exhibits no discharge. No scleral icterus.  Neck: Normal range of motion. No JVD present. No tracheal deviation present.  Pulmonary/Chest: Effort normal. No stridor.  Neurological: He is alert and oriented to person, place, and time. Coordination normal.  Skin:  Open wound to right upper lateral thigh-small area of induration surrounding, no redness, small purulent discharge in granulation tissue noted  Psychiatric: He has a normal mood and affect. His behavior is normal. Judgment and thought content normal.  Nursing note and vitals reviewed.    ED Treatments / Results  Labs (all labs ordered are listed, but only abnormal results are displayed) Labs Reviewed - No data to display  EKG  EKG Interpretation None       Radiology No results found.  Procedures Procedures (including critical care time)  Medications Ordered in ED Medications -  No data to display   Initial Impression / Assessment and Plan / ED Course  I have reviewed the triage vital signs and the nursing notes.  Pertinent labs & imaging results that were available during my care of the patient were reviewed by me and considered in my medical decision making (see chart for details).      Final Clinical Impressions(s) / ED Diagnoses   Final diagnoses:  Wound check, abscess    26 year old male here for wound check.  Wound appears to be improving, no signs of significant infection.  Patient will continue warm compresses, frequent  showers, return immediately with any worsening signs or symptoms.  Patient verbalized understanding and agreement to today's plan had no further questions or concerns at time discharge.  New Prescriptions New Prescriptions   No medications on file     Eyvonne MechanicHedges, Virdie Penning, Cordelia Poche-C 10/08/17 1249    Tegeler, Canary Brimhristopher J, MD 10/08/17 [phone]

## 2018-10-19 ENCOUNTER — Emergency Department (HOSPITAL_COMMUNITY)
Admission: EM | Admit: 2018-10-19 | Discharge: 2018-10-19 | Disposition: A | Discharge status: Home / Self Care | Payer: Self-pay | Attending: Emergency Medicine | Admitting: Emergency Medicine

## 2018-10-19 ENCOUNTER — Emergency Department (HOSPITAL_COMMUNITY): Payer: Self-pay

## 2018-10-19 ENCOUNTER — Other Ambulatory Visit: Payer: Self-pay

## 2018-10-19 ENCOUNTER — Encounter (HOSPITAL_COMMUNITY): Payer: Self-pay | Admitting: Emergency Medicine

## 2018-10-19 DIAGNOSIS — S40211A Abrasion of right shoulder, initial encounter: Secondary | ICD-10-CM | POA: Insufficient documentation

## 2018-10-19 DIAGNOSIS — S0083XA Contusion of other part of head, initial encounter: Secondary | ICD-10-CM | POA: Insufficient documentation

## 2018-10-19 DIAGNOSIS — M25571 Pain in right ankle and joints of right foot: Secondary | ICD-10-CM | POA: Insufficient documentation

## 2018-10-19 DIAGNOSIS — F17210 Nicotine dependence, cigarettes, uncomplicated: Secondary | ICD-10-CM | POA: Insufficient documentation

## 2018-10-19 DIAGNOSIS — Y939 Activity, unspecified: Secondary | ICD-10-CM | POA: Insufficient documentation

## 2018-10-19 DIAGNOSIS — S0990XA Unspecified injury of head, initial encounter: Secondary | ICD-10-CM

## 2018-10-19 DIAGNOSIS — Y999 Unspecified external cause status: Secondary | ICD-10-CM | POA: Insufficient documentation

## 2018-10-19 DIAGNOSIS — Y929 Unspecified place or not applicable: Secondary | ICD-10-CM | POA: Insufficient documentation

## 2018-10-19 DIAGNOSIS — W01198A Fall on same level from slipping, tripping and stumbling with subsequent striking against other object, initial encounter: Secondary | ICD-10-CM | POA: Insufficient documentation

## 2018-10-19 MED ORDER — IBUPROFEN 800 MG PO TABS: 800.0000 mg | ORAL_TABLET | Freq: Three times a day (TID) | ORAL | 0 refills | Status: DC

## 2018-10-19 MED ORDER — IBUPROFEN 800 MG PO TABS
800.0000 mg | ORAL_TABLET | Freq: Once | ORAL | Status: AC
  Administered 2018-10-19: 800 mg via ORAL
  Filled 2018-10-19: qty 1
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
## 2018-10-19 NOTE — Discharge Instructions (Signed)
Your CT scans and xrays including your chest and ankle are normal tonight.  Rest, use ibuprofen as prescribed, ice and elevation for your ankle pain.  Wear the ankle brace for comfort.

## 2019-07-13 ENCOUNTER — Other Ambulatory Visit: Payer: Self-pay

## 2019-07-13 ENCOUNTER — Encounter (HOSPITAL_COMMUNITY): Payer: Self-pay | Admitting: *Deleted

## 2019-07-13 ENCOUNTER — Emergency Department (HOSPITAL_COMMUNITY)
Admission: EM | Admit: 2019-07-13 | Discharge: 2019-07-13 | Disposition: A | Discharge status: Home / Self Care | Payer: Self-pay | Attending: Emergency Medicine | Admitting: Emergency Medicine

## 2019-07-13 DIAGNOSIS — F17210 Nicotine dependence, cigarettes, uncomplicated: Secondary | ICD-10-CM | POA: Insufficient documentation

## 2019-07-13 DIAGNOSIS — Z79899 Other long term (current) drug therapy: Secondary | ICD-10-CM | POA: Insufficient documentation

## 2019-07-13 DIAGNOSIS — M7918 Myalgia, other site: Secondary | ICD-10-CM | POA: Insufficient documentation

## 2019-07-13 MED ORDER — METHOCARBAMOL 500 MG PO TABS: 500.0000 mg | ORAL_TABLET | Freq: Two times a day (BID) | ORAL | 0 refills | Status: AC

## 2019-07-13 MED ORDER — LIDOCAINE 5 % EX PTCH: 1.0000 | MEDICATED_PATCH | CUTANEOUS | 0 refills | Status: DC
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
## 2019-07-26 ENCOUNTER — Encounter (HOSPITAL_COMMUNITY): Payer: Self-pay

## 2019-07-26 ENCOUNTER — Other Ambulatory Visit: Payer: Self-pay

## 2019-07-26 ENCOUNTER — Emergency Department (HOSPITAL_COMMUNITY)
Admission: EM | Admit: 2019-07-26 | Discharge: 2019-07-27 | Disposition: A | Discharge status: Home / Self Care | Payer: Self-pay | Attending: Emergency Medicine | Admitting: Emergency Medicine

## 2019-07-26 DIAGNOSIS — F17210 Nicotine dependence, cigarettes, uncomplicated: Secondary | ICD-10-CM | POA: Insufficient documentation

## 2019-07-26 DIAGNOSIS — M546 Pain in thoracic spine: Secondary | ICD-10-CM | POA: Insufficient documentation

## 2019-07-26 DIAGNOSIS — M545 Low back pain: Secondary | ICD-10-CM | POA: Insufficient documentation

## 2019-07-26 DIAGNOSIS — M549 Dorsalgia, unspecified: Secondary | ICD-10-CM
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
## 2019-07-27 MED ORDER — CYCLOBENZAPRINE HCL 10 MG PO TABS: 10.0000 mg | ORAL_TABLET | Freq: Two times a day (BID) | ORAL | 0 refills | Status: DC | PRN

## 2019-07-27 MED ORDER — MELOXICAM 15 MG PO TABS: 15.0000 mg | ORAL_TABLET | Freq: Every day | ORAL | 0 refills | Status: DC

## 2019-07-27 MED ORDER — IBUPROFEN 800 MG PO TABS
800.0000 mg | ORAL_TABLET | Freq: Once | ORAL | Status: AC
  Administered 2019-07-27: 800 mg via ORAL
  Filled 2019-07-27: qty 1
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
## 2019-07-27 NOTE — Discharge Instructions (Signed)
1. Medications: flexeril, mobic, usual home medications 2. Treatment: rest, drink plenty of fluids, gentle stretching as discussed, alternate ice and heat 3. Follow Up: Please followup with your primary doctor in 3 days for discussion of your diagnoses and further evaluation after today's visit; if you do not have a primary care doctor use the resource guide provided to find one;  Return to the ER for worsening back pain, difficulty walking, loss of bowel or bladder control or other concerning symptoms

## 2019-12-06 ENCOUNTER — Encounter (HOSPITAL_COMMUNITY): Payer: Self-pay

## 2019-12-06 ENCOUNTER — Other Ambulatory Visit: Payer: Self-pay

## 2019-12-06 ENCOUNTER — Emergency Department (HOSPITAL_COMMUNITY)
Admission: EM | Admit: 2019-12-06 | Discharge: 2019-12-06 | Disposition: A | Discharge status: Home / Self Care | Payer: Self-pay | Attending: Emergency Medicine | Admitting: Emergency Medicine

## 2019-12-06 DIAGNOSIS — Y92410 Unspecified street and highway as the place of occurrence of the external cause: Secondary | ICD-10-CM | POA: Insufficient documentation

## 2019-12-06 DIAGNOSIS — Y998 Other external cause status: Secondary | ICD-10-CM | POA: Insufficient documentation

## 2019-12-06 DIAGNOSIS — Y9389 Activity, other specified: Secondary | ICD-10-CM | POA: Insufficient documentation

## 2019-12-06 DIAGNOSIS — Z79899 Other long term (current) drug therapy: Secondary | ICD-10-CM | POA: Insufficient documentation

## 2019-12-06 DIAGNOSIS — T148XXA Other injury of unspecified body region, initial encounter: Secondary | ICD-10-CM | POA: Insufficient documentation

## 2019-12-06 DIAGNOSIS — F17210 Nicotine dependence, cigarettes, uncomplicated: Secondary | ICD-10-CM | POA: Insufficient documentation

## 2019-12-06 MED ORDER — METHOCARBAMOL 500 MG PO TABS: 500.0000 mg | ORAL_TABLET | Freq: Two times a day (BID) | ORAL | 0 refills | Status: AC

## 2019-12-06 MED ORDER — NAPROXEN 500 MG PO TABS: 500.0000 mg | ORAL_TABLET | Freq: Two times a day (BID) | ORAL | 0 refills | Status: AC
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
## 2019-12-12 ENCOUNTER — Emergency Department (HOSPITAL_COMMUNITY)
Admission: EM | Admit: 2019-12-12 | Discharge: 2019-12-12 | Disposition: A | Discharge status: Home / Self Care | Payer: Self-pay | Attending: Emergency Medicine | Admitting: Emergency Medicine

## 2019-12-12 ENCOUNTER — Emergency Department (HOSPITAL_COMMUNITY): Payer: Self-pay

## 2019-12-12 DIAGNOSIS — Y999 Unspecified external cause status: Secondary | ICD-10-CM | POA: Insufficient documentation

## 2019-12-12 DIAGNOSIS — Z791 Long term (current) use of non-steroidal anti-inflammatories (NSAID): Secondary | ICD-10-CM | POA: Insufficient documentation

## 2019-12-12 DIAGNOSIS — Y92410 Unspecified street and highway as the place of occurrence of the external cause: Secondary | ICD-10-CM | POA: Insufficient documentation

## 2019-12-12 DIAGNOSIS — T148XXA Other injury of unspecified body region, initial encounter: Secondary | ICD-10-CM

## 2019-12-12 DIAGNOSIS — Y939 Activity, unspecified: Secondary | ICD-10-CM | POA: Insufficient documentation

## 2019-12-12 DIAGNOSIS — F17210 Nicotine dependence, cigarettes, uncomplicated: Secondary | ICD-10-CM | POA: Insufficient documentation

## 2019-12-12 DIAGNOSIS — M898X1 Other specified disorders of bone, shoulder: Secondary | ICD-10-CM

## 2019-12-12 DIAGNOSIS — S46911A Strain of unspecified muscle, fascia and tendon at shoulder and upper arm level, right arm, initial encounter: Secondary | ICD-10-CM | POA: Insufficient documentation

## 2019-12-12 MED ORDER — PREDNISONE 20 MG PO TABS: 40.0000 mg | ORAL_TABLET | Freq: Every day | ORAL | 0 refills | Status: AC

## 2019-12-12 NOTE — ED Notes (Signed)
Pt undressed in gown ready for DG
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
## 2019-12-12 NOTE — Discharge Instructions (Signed)
I have prescribed steroids to help with your pain, please take 2 tablets daily for the next 5 days.  Please be aware this medication can cause insomnia, flushes, changes in appetite.  You may also benefit from applying heat or ice to your right scapula.  Follow-up with your primary care physician as needed.
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
## 2019-12-12 NOTE — ED Notes (Signed)
Pt verbalizes understanding of DC instructions. Pt belongings returned and is ambulatory out of ED.  

## 2020-01-18 ENCOUNTER — Inpatient Hospital Stay (HOSPITAL_COMMUNITY): Payer: No Typology Code available for payment source

## 2020-01-18 ENCOUNTER — Emergency Department (HOSPITAL_COMMUNITY): Payer: No Typology Code available for payment source

## 2020-01-18 ENCOUNTER — Encounter (HOSPITAL_COMMUNITY): Admission: EM | Disposition: A | Discharge status: Home / Self Care | Payer: Self-pay | Source: Home / Self Care

## 2020-01-18 ENCOUNTER — Inpatient Hospital Stay (HOSPITAL_COMMUNITY)
Admission: EM | Admit: 2020-01-18 | Discharge: 2020-01-21 | DRG: 579 | Disposition: A | Discharge status: Home / Self Care | Payer: No Typology Code available for payment source | Attending: General Surgery | Admitting: General Surgery

## 2020-01-18 ENCOUNTER — Inpatient Hospital Stay (HOSPITAL_COMMUNITY): Payer: No Typology Code available for payment source | Admitting: Certified Registered Nurse Anesthetist

## 2020-01-18 DIAGNOSIS — S022XXA Fracture of nasal bones, initial encounter for closed fracture: Secondary | ICD-10-CM | POA: Diagnosis present

## 2020-01-18 DIAGNOSIS — Y92411 Interstate highway as the place of occurrence of the external cause: Secondary | ICD-10-CM | POA: Diagnosis not present

## 2020-01-18 DIAGNOSIS — R21 Rash and other nonspecific skin eruption: Secondary | ICD-10-CM | POA: Diagnosis present

## 2020-01-18 DIAGNOSIS — R4182 Altered mental status, unspecified: Secondary | ICD-10-CM | POA: Diagnosis present

## 2020-01-18 DIAGNOSIS — S62319A Displaced fracture of base of unspecified metacarpal bone, initial encounter for closed fracture: Secondary | ICD-10-CM

## 2020-01-18 DIAGNOSIS — Z20822 Contact with and (suspected) exposure to covid-19: Secondary | ICD-10-CM | POA: Diagnosis present

## 2020-01-18 DIAGNOSIS — D62 Acute posthemorrhagic anemia: Secondary | ICD-10-CM | POA: Diagnosis present

## 2020-01-18 DIAGNOSIS — S51812A Laceration without foreign body of left forearm, initial encounter: Secondary | ICD-10-CM

## 2020-01-18 DIAGNOSIS — F10129 Alcohol abuse with intoxication, unspecified: Secondary | ICD-10-CM | POA: Diagnosis present

## 2020-01-18 DIAGNOSIS — N179 Acute kidney failure, unspecified: Secondary | ICD-10-CM | POA: Diagnosis present

## 2020-01-18 DIAGNOSIS — G510 Bell's palsy: Secondary | ICD-10-CM | POA: Diagnosis not present

## 2020-01-18 DIAGNOSIS — Z0189 Encounter for other specified special examinations: Secondary | ICD-10-CM

## 2020-01-18 DIAGNOSIS — J9601 Acute respiratory failure with hypoxia: Secondary | ICD-10-CM | POA: Diagnosis not present

## 2020-01-18 DIAGNOSIS — E871 Hypo-osmolality and hyponatremia: Secondary | ICD-10-CM | POA: Diagnosis present

## 2020-01-18 DIAGNOSIS — E876 Hypokalemia: Secondary | ICD-10-CM | POA: Diagnosis present

## 2020-01-18 DIAGNOSIS — S62171A Displaced fracture of trapezium [larger multangular], right wrist, initial encounter for closed fracture: Secondary | ICD-10-CM | POA: Diagnosis present

## 2020-01-18 DIAGNOSIS — R402252 Coma scale, best verbal response, oriented, at arrival to emergency department: Secondary | ICD-10-CM | POA: Diagnosis present

## 2020-01-18 DIAGNOSIS — Z23 Encounter for immunization: Secondary | ICD-10-CM

## 2020-01-18 DIAGNOSIS — R402362 Coma scale, best motor response, obeys commands, at arrival to emergency department: Secondary | ICD-10-CM | POA: Diagnosis present

## 2020-01-18 DIAGNOSIS — S025XXA Fracture of tooth (traumatic), initial encounter for closed fracture: Secondary | ICD-10-CM | POA: Diagnosis present

## 2020-01-18 DIAGNOSIS — S62001A Unspecified fracture of navicular [scaphoid] bone of right wrist, initial encounter for closed fracture: Secondary | ICD-10-CM

## 2020-01-18 DIAGNOSIS — S61200A Unspecified open wound of right index finger without damage to nail, initial encounter: Secondary | ICD-10-CM

## 2020-01-18 DIAGNOSIS — R402142 Coma scale, eyes open, spontaneous, at arrival to emergency department: Secondary | ICD-10-CM | POA: Diagnosis present

## 2020-01-18 DIAGNOSIS — S63124A Dislocation of unspecified interphalangeal joint of right thumb, initial encounter: Secondary | ICD-10-CM

## 2020-01-18 DIAGNOSIS — Y900 Blood alcohol level of less than 20 mg/100 ml: Secondary | ICD-10-CM

## 2020-01-18 DIAGNOSIS — Z4659 Encounter for fitting and adjustment of other gastrointestinal appliance and device: Secondary | ICD-10-CM

## 2020-01-18 DIAGNOSIS — S62310A Displaced fracture of base of second metacarpal bone, right hand, initial encounter for closed fracture: Secondary | ICD-10-CM | POA: Diagnosis present

## 2020-01-18 DIAGNOSIS — Z978 Presence of other specified devices: Secondary | ICD-10-CM

## 2020-01-18 DIAGNOSIS — S56522A Laceration of other extensor muscle, fascia and tendon at forearm level, left arm, initial encounter: Secondary | ICD-10-CM | POA: Diagnosis present

## 2020-01-18 DIAGNOSIS — S61412A Laceration without foreign body of left hand, initial encounter: Secondary | ICD-10-CM | POA: Diagnosis present

## 2020-01-18 DIAGNOSIS — S61210A Laceration without foreign body of right index finger without damage to nail, initial encounter: Secondary | ICD-10-CM | POA: Diagnosis present

## 2020-01-18 DIAGNOSIS — R000 Tachycardia, unspecified: Secondary | ICD-10-CM | POA: Diagnosis present

## 2020-01-18 DIAGNOSIS — F10221 Alcohol dependence with intoxication delirium: Secondary | ICD-10-CM

## 2020-01-18 HISTORY — PX: I & D EXTREMITY: SHX5045

## 2020-01-18 HISTORY — PX: OPEN REDUCTION INTERNAL FIXATION (ORIF) METACARPAL: SHX6234

## 2020-01-18 LAB — LACTIC ACID, PLASMA
Lactic Acid, Venous: 6.6 mmol/L (ref 0.5–1.9)
Lactic Acid, Venous: 4.6 mmol/L (ref 0.5–1.9)

## 2020-01-18 LAB — COMPREHENSIVE METABOLIC PANEL
ALT: 90 U/L — ABNORMAL HIGH (ref 0–44)
AST: 162 U/L — ABNORMAL HIGH (ref 15–41)
Albumin: 4.7 g/dL (ref 3.5–5.0)
Alkaline Phosphatase: 69 U/L (ref 38–126)
Anion gap: 23 — ABNORMAL HIGH (ref 5–15)
BUN: 14 mg/dL (ref 6–20)
CO2: 16 mmol/L — ABNORMAL LOW (ref 22–32)
Calcium: 8.9 mg/dL (ref 8.9–10.3)
Chloride: 97 mmol/L — ABNORMAL LOW (ref 98–111)
Creatinine, Ser: 1.49 mg/dL — ABNORMAL HIGH (ref 0.61–1.24)
GFR calc Af Amer: >60 mL/min (ref >60)
GFR calc non Af Amer: >60 mL/min (ref >60)
Glucose, Bld: 119 mg/dL — ABNORMAL HIGH (ref 70–99)
Potassium: 3.6 mmol/L (ref 3.5–5.1)
Sodium: 136 mmol/L (ref 135–145)
Total Bilirubin: 1.2 mg/dL (ref 0.3–1.2)
Total Protein: 7.7 g/dL (ref 6.5–8.1)

## 2020-01-18 LAB — POCT I-STAT 7, (LYTES, BLD GAS, ICA,H+H)
Acid-base deficit: 8 mmol/L — ABNORMAL HIGH (ref 0.0–2.0)
Bicarbonate: 17.9 mmol/L — ABNORMAL LOW (ref 20.0–28.0)
Calcium, Ion: 1.07 mmol/L — ABNORMAL LOW (ref 1.15–1.40)
HCT: 38 % — ABNORMAL LOW (ref 39.0–52.0)
Hemoglobin: 12.9 g/dL — ABNORMAL LOW (ref 13.0–17.0)
O2 Saturation: 100 %
Patient temperature: 98.6
Potassium: 3.1 mmol/L — ABNORMAL LOW (ref 3.5–5.1)
Sodium: 135 mmol/L (ref 135–145)
TCO2: 19 mmol/L — ABNORMAL LOW (ref 22–32)
pCO2 arterial: 37.6 mmHg (ref 32.0–48.0)
pH, Arterial: 7.285 — ABNORMAL LOW (ref 7.350–7.450)
pO2, Arterial: 389 mmHg — ABNORMAL HIGH (ref 83.0–108.0)

## 2020-01-18 LAB — RESPIRATORY PANEL BY RT PCR (FLU A&B, COVID)
Influenza A by PCR: NEGATIVE
Influenza B by PCR: NEGATIVE
SARS Coronavirus 2 by RT PCR: NEGATIVE

## 2020-01-18 LAB — I-STAT CHEM 8, ED
BUN: 18 mg/dL (ref 6–20)
Calcium, Ion: 1.02 mmol/L — ABNORMAL LOW (ref 1.15–1.40)
Chloride: 103 mmol/L (ref 98–111)
Creatinine, Ser: 1.7 mg/dL — ABNORMAL HIGH (ref 0.61–1.24)
Glucose, Bld: 115 mg/dL — ABNORMAL HIGH (ref 70–99)
HCT: 47 % (ref 39.0–52.0)
Hemoglobin: 16 g/dL (ref 13.0–17.0)
Potassium: 3.5 mmol/L (ref 3.5–5.1)
Sodium: 137 mmol/L (ref 135–145)
TCO2: 19 mmol/L — ABNORMAL LOW (ref 22–32)

## 2020-01-18 LAB — URINALYSIS, ROUTINE W REFLEX MICROSCOPIC
Bilirubin Urine: NEGATIVE
Glucose, UA: NEGATIVE mg/dL
Ketones, ur: 5 mg/dL — AB
Leukocytes,Ua: NEGATIVE
Nitrite: NEGATIVE
Protein, ur: 100 mg/dL — AB
RBC / HPF: >50 RBC/hpf — ABNORMAL HIGH (ref 0–5)
Specific Gravity, Urine: 1.019 (ref 1.005–1.030)
pH: 6 (ref 5.0–8.0)

## 2020-01-18 LAB — CBC
HCT: 45.3 % (ref 39.0–52.0)
HCT: 36.9 % — ABNORMAL LOW (ref 39.0–52.0)
HCT: 34.8 % — ABNORMAL LOW (ref 39.0–52.0)
Hemoglobin: 15.2 g/dL (ref 13.0–17.0)
Hemoglobin: 12.7 g/dL — ABNORMAL LOW (ref 13.0–17.0)
Hemoglobin: 12.5 g/dL — ABNORMAL LOW (ref 13.0–17.0)
MCH: 32.2 pg (ref 26.0–34.0)
MCH: 31.8 pg (ref 26.0–34.0)
MCH: 32.4 pg (ref 26.0–34.0)
MCHC: 33.6 g/dL (ref 30.0–36.0)
MCHC: 34.4 g/dL (ref 30.0–36.0)
MCHC: 35.9 g/dL (ref 30.0–36.0)
MCV: 96 fL (ref 80.0–100.0)
MCV: 92.5 fL (ref 80.0–100.0)
MCV: 90.2 fL (ref 80.0–100.0)
Platelets: 330 10*3/uL (ref 150–400)
Platelets: 274 10*3/uL (ref 150–400)
Platelets: 223 10*3/uL (ref 150–400)
RBC: 4.72 MIL/uL (ref 4.22–5.81)
RBC: 3.99 MIL/uL — ABNORMAL LOW (ref 4.22–5.81)
RBC: 3.86 MIL/uL — ABNORMAL LOW (ref 4.22–5.81)
RDW: 13.9 % (ref 11.5–15.5)
RDW: 14 % (ref 11.5–15.5)
RDW: 13.8 % (ref 11.5–15.5)
WBC: 15.1 10*3/uL — ABNORMAL HIGH (ref 4.0–10.5)
WBC: 12.9 10*3/uL — ABNORMAL HIGH (ref 4.0–10.5)
WBC: 8.9 10*3/uL (ref 4.0–10.5)
nRBC: 0 % (ref 0.0–0.2)
nRBC: 0 % (ref 0.0–0.2)
nRBC: 0 % (ref 0.0–0.2)

## 2020-01-18 LAB — BASIC METABOLIC PANEL
Anion gap: 15 (ref 5–15)
Anion gap: 11 (ref 5–15)
BUN: 16 mg/dL (ref 6–20)
BUN: 12 mg/dL (ref 6–20)
CO2: 17 mmol/L — ABNORMAL LOW (ref 22–32)
CO2: 18 mmol/L — ABNORMAL LOW (ref 22–32)
Calcium: 8.2 mg/dL — ABNORMAL LOW (ref 8.9–10.3)
Calcium: 8 mg/dL — ABNORMAL LOW (ref 8.9–10.3)
Chloride: 105 mmol/L (ref 98–111)
Chloride: 104 mmol/L (ref 98–111)
Creatinine, Ser: 1.32 mg/dL — ABNORMAL HIGH (ref 0.61–1.24)
Creatinine, Ser: 1.17 mg/dL (ref 0.61–1.24)
GFR calc Af Amer: >60 mL/min (ref >60)
GFR calc Af Amer: >60 mL/min (ref >60)
GFR calc non Af Amer: >60 mL/min (ref >60)
GFR calc non Af Amer: >60 mL/min (ref >60)
Glucose, Bld: 95 mg/dL (ref 70–99)
Glucose, Bld: 68 mg/dL — ABNORMAL LOW (ref 70–99)
Potassium: 4.2 mmol/L (ref 3.5–5.1)
Potassium: 4.2 mmol/L (ref 3.5–5.1)
Sodium: 137 mmol/L (ref 135–145)
Sodium: 133 mmol/L — ABNORMAL LOW (ref 135–145)

## 2020-01-18 LAB — RAPID URINE DRUG SCREEN, HOSP PERFORMED
Amphetamines: NOT DETECTED
Barbiturates: NOT DETECTED
Benzodiazepines: NOT DETECTED
Cocaine: NOT DETECTED
Opiates: NOT DETECTED
Tetrahydrocannabinol: NOT DETECTED

## 2020-01-18 LAB — ETHANOL: Alcohol, Ethyl (B): 242 mg/dL — ABNORMAL HIGH (ref <10)

## 2020-01-18 LAB — SAMPLE TO BLOOD BANK

## 2020-01-18 LAB — PROTIME-INR
INR: 1.2 (ref 0.8–1.2)
Prothrombin Time: 15.1 seconds (ref 11.4–15.2)

## 2020-01-18 LAB — MRSA PCR SCREENING: MRSA by PCR: NEGATIVE

## 2020-01-18 LAB — HIV ANTIBODY (ROUTINE TESTING W REFLEX): HIV Screen 4th Generation wRfx: NONREACTIVE

## 2020-01-18 LAB — CDS SEROLOGY

## 2020-01-18 SURGERY — IRRIGATION AND DEBRIDEMENT EXTREMITY
Anesthesia: General | Site: Finger | Laterality: Right

## 2020-01-18 MED ORDER — CEFAZOLIN SODIUM-DEXTROSE 2-4 GM/100ML-% IV SOLN
2.0000 g | Freq: Three times a day (TID) | INTRAVENOUS | Status: AC
  Administered 2020-01-19 (×3): 2 g via INTRAVENOUS
  Filled 2020-01-18 (×3): qty 100

## 2020-01-18 MED ORDER — PHENYLEPHRINE HCL (PRESSORS) 10 MG/ML IV SOLN
INTRAVENOUS | Status: DC | PRN
  Administered 2020-01-18: 80 ug via INTRAVENOUS

## 2020-01-18 MED ORDER — FENTANYL CITRATE (PF) 100 MCG/2ML IJ SOLN
50.0000 ug | Freq: Once | INTRAMUSCULAR | Status: AC
  Administered 2020-01-18: 50 ug via INTRAVENOUS

## 2020-01-18 MED ORDER — FENTANYL 2500MCG IN NS 250ML (10MCG/ML) PREMIX INFUSION
0.0000 ug/h | INTRAVENOUS | Status: DC
  Administered 2020-01-18: 18:00:00 150 ug/h via INTRAVENOUS
  Administered 2020-01-18: 100 ug/h via INTRAVENOUS
  Filled 2020-01-18 (×2): qty 250

## 2020-01-18 MED ORDER — ONDANSETRON HCL 4 MG/2ML IJ SOLN: 4.0000 mg | Freq: Four times a day (QID) | INTRAMUSCULAR | Status: DC | PRN

## 2020-01-18 MED ORDER — PROPOFOL 10 MG/ML IV BOLUS
INTRAVENOUS | Status: AC
  Filled 2020-01-18: qty 20

## 2020-01-18 MED ORDER — POVIDONE-IODINE 10 % EX SWAB: 2.0000 "application " | Freq: Once | CUTANEOUS | Status: DC

## 2020-01-18 MED ORDER — ROCURONIUM BROMIDE 50 MG/5ML IV SOLN
INTRAVENOUS | Status: AC | PRN
  Administered 2020-01-18: 100 mg via INTRAVENOUS

## 2020-01-18 MED ORDER — FOLIC ACID 5 MG/ML IJ SOLN
1.0000 mg | Freq: Every day | INTRAMUSCULAR | Status: DC
  Administered 2020-01-18: 1 mg via INTRAVENOUS
  Filled 2020-01-18 (×3): qty 0.2

## 2020-01-18 MED ORDER — CHLORHEXIDINE GLUCONATE 4 % EX LIQD
60.0000 mL | Freq: Once | CUTANEOUS | Status: AC
  Administered 2020-01-18: 4 via TOPICAL
  Filled 2020-01-18: qty 60

## 2020-01-18 MED ORDER — LACTATED RINGERS IV BOLUS
1000.0000 mL | Freq: Once | INTRAVENOUS | Status: AC
  Administered 2020-01-18: 1000 mL via INTRAVENOUS

## 2020-01-18 MED ORDER — LACTATED RINGERS IV SOLN: INTRAVENOUS | Status: DC

## 2020-01-18 MED ORDER — CEFAZOLIN SODIUM-DEXTROSE 2-4 GM/100ML-% IV SOLN
2.0000 g | INTRAVENOUS | Status: AC
  Administered 2020-01-18 (×2): 2 g via INTRAVENOUS

## 2020-01-18 MED ORDER — METHOCARBAMOL 500 MG PO TABS
1000.0000 mg | ORAL_TABLET | Freq: Three times a day (TID) | ORAL | Status: DC
  Administered 2020-01-19 (×2): 1000 mg
  Filled 2020-01-18 (×2): qty 2

## 2020-01-18 MED ORDER — CEFAZOLIN SODIUM-DEXTROSE 2-4 GM/100ML-% IV SOLN
2.0000 g | Freq: Once | INTRAVENOUS | Status: AC
  Administered 2020-01-18: 04:00:00 2 g via INTRAVENOUS
  Filled 2020-01-18: qty 100

## 2020-01-18 MED ORDER — ACETAMINOPHEN 500 MG PO TABS
1000.0000 mg | ORAL_TABLET | Freq: Four times a day (QID) | ORAL | Status: AC
  Administered 2020-01-19: 01:00:00 1000 mg
  Filled 2020-01-18: qty 2

## 2020-01-18 MED ORDER — ENOXAPARIN SODIUM 40 MG/0.4ML ~~LOC~~ SOLN
40.0000 mg | SUBCUTANEOUS | Status: DC
  Administered 2020-01-18 – 2020-01-21 (×4): 40 mg via SUBCUTANEOUS
  Filled 2020-01-18 (×4): qty 0.4

## 2020-01-18 MED ORDER — THIAMINE HCL 100 MG/ML IJ SOLN
100.0000 mg | Freq: Every day | INTRAMUSCULAR | Status: DC
  Administered 2020-01-18 – 2020-01-20 (×3): 100 mg via INTRAVENOUS
  Filled 2020-01-18 (×3): qty 2

## 2020-01-18 MED ORDER — ROCURONIUM BROMIDE 10 MG/ML (PF) SYRINGE
PREFILLED_SYRINGE | INTRAVENOUS | Status: DC | PRN
  Administered 2020-01-18: 30 mg via INTRAVENOUS
  Administered 2020-01-18: 50 mg via INTRAVENOUS
  Administered 2020-01-18: 20 mg via INTRAVENOUS

## 2020-01-18 MED ORDER — CEFAZOLIN SODIUM 1 G IJ SOLR
INTRAMUSCULAR | Status: AC
  Filled 2020-01-18: qty 20

## 2020-01-18 MED ORDER — PHENYLEPHRINE 40 MCG/ML (10ML) SYRINGE FOR IV PUSH (FOR BLOOD PRESSURE SUPPORT)
PREFILLED_SYRINGE | INTRAVENOUS | Status: AC
  Filled 2020-01-18: qty 20

## 2020-01-18 MED ORDER — ACETAMINOPHEN 325 MG PO TABS
650.0000 mg | ORAL_TABLET | ORAL | Status: DC | PRN
  Administered 2020-01-19 – 2020-01-20 (×2): 650 mg via ORAL
  Filled 2020-01-18 (×2): qty 2

## 2020-01-18 MED ORDER — OXYCODONE HCL 5 MG/5ML PO SOLN: 5.0000 mg | ORAL | Status: DC | PRN

## 2020-01-18 MED ORDER — FENTANYL CITRATE (PF) 250 MCG/5ML IJ SOLN
INTRAMUSCULAR | Status: AC
  Filled 2020-01-18: qty 5

## 2020-01-18 MED ORDER — PROPOFOL 1000 MG/100ML IV EMUL: 5.0000 ug/kg/min | INTRAVENOUS | Status: DC

## 2020-01-18 MED ORDER — PANTOPRAZOLE SODIUM 40 MG PO TBEC
40.0000 mg | DELAYED_RELEASE_TABLET | Freq: Every day | ORAL | Status: DC
  Administered 2020-01-20 – 2020-01-21 (×2): 40 mg via ORAL
  Filled 2020-01-18 (×2): qty 1

## 2020-01-18 MED ORDER — ONDANSETRON HCL 4 MG/2ML IJ SOLN
INTRAMUSCULAR | Status: AC
  Filled 2020-01-18: qty 4

## 2020-01-18 MED ORDER — PROPOFOL 1000 MG/100ML IV EMUL
INTRAVENOUS | Status: AC
  Filled 2020-01-18: qty 100

## 2020-01-18 MED ORDER — 0.9 % SODIUM CHLORIDE (POUR BTL) OPTIME
TOPICAL | Status: DC | PRN
  Administered 2020-01-18: 1000 mL

## 2020-01-18 MED ORDER — PROPOFOL 1000 MG/100ML IV EMUL
0.0000 ug/kg/min | INTRAVENOUS | Status: DC
  Administered 2020-01-18: 08:00:00 35 ug/kg/min via INTRAVENOUS
  Administered 2020-01-18: 40 ug/kg/min via INTRAVENOUS
  Administered 2020-01-18: 30 ug/kg/min via INTRAVENOUS
  Administered 2020-01-19: 40 ug/kg/min via INTRAVENOUS
  Filled 2020-01-18 (×4): qty 100

## 2020-01-18 MED ORDER — MIDAZOLAM HCL 2 MG/2ML IJ SOLN
INTRAMUSCULAR | Status: AC
  Filled 2020-01-18: qty 2

## 2020-01-18 MED ORDER — FENTANYL CITRATE (PF) 250 MCG/5ML IJ SOLN
INTRAMUSCULAR | Status: DC | PRN
  Administered 2020-01-18 (×3): 50 ug via INTRAVENOUS
  Administered 2020-01-18: 100 ug via INTRAVENOUS
  Administered 2020-01-18: 50 ug via INTRAVENOUS
  Administered 2020-01-18 (×2): 100 ug via INTRAVENOUS

## 2020-01-18 MED ORDER — PANTOPRAZOLE SODIUM 40 MG IV SOLR
40.0000 mg | Freq: Every day | INTRAVENOUS | Status: DC
  Administered 2020-01-18 – 2020-01-19 (×2): 40 mg via INTRAVENOUS
  Filled 2020-01-18 (×2): qty 40

## 2020-01-18 MED ORDER — CHLORHEXIDINE GLUCONATE CLOTH 2 % EX PADS
6.0000 | MEDICATED_PAD | Freq: Every day | CUTANEOUS | Status: DC
  Administered 2020-01-18 – 2020-01-21 (×3): 6 via TOPICAL

## 2020-01-18 MED ORDER — SODIUM CHLORIDE 0.9 % IV SOLN: 1.0000 mg | Freq: Once | INTRAVENOUS | Status: DC

## 2020-01-18 MED ORDER — SUCCINYLCHOLINE CHLORIDE 200 MG/10ML IV SOSY
PREFILLED_SYRINGE | INTRAVENOUS | Status: AC
  Filled 2020-01-18: qty 20

## 2020-01-18 MED ORDER — FENTANYL CITRATE (PF) 100 MCG/2ML IJ SOLN: 50.0000 ug | INTRAMUSCULAR | Status: DC | PRN

## 2020-01-18 MED ORDER — FENTANYL CITRATE (PF) 100 MCG/2ML IJ SOLN
INTRAMUSCULAR | Status: AC
  Filled 2020-01-18: qty 2

## 2020-01-18 MED ORDER — ORAL CARE MOUTH RINSE
15.0000 mL | OROMUCOSAL | Status: DC
  Administered 2020-01-18 – 2020-01-19 (×10): 15 mL via OROMUCOSAL

## 2020-01-18 MED ORDER — MIDAZOLAM HCL 2 MG/2ML IJ SOLN
5.0000 mg | Freq: Once | INTRAMUSCULAR | Status: AC
  Administered 2020-01-18: 5 mg via INTRAVENOUS

## 2020-01-18 MED ORDER — ROCURONIUM BROMIDE 10 MG/ML (PF) SYRINGE
PREFILLED_SYRINGE | INTRAVENOUS | Status: AC
  Filled 2020-01-18: qty 40

## 2020-01-18 MED ORDER — FENTANYL CITRATE (PF) 2500 MCG/50ML IJ SOLN: 5.0000 ug/h | Status: DC

## 2020-01-18 MED ORDER — FENTANYL CITRATE (PF) 100 MCG/2ML IJ SOLN
50.0000 ug | INTRAMUSCULAR | Status: DC | PRN
  Administered 2020-01-18: 05:00:00 50 ug via INTRAVENOUS

## 2020-01-18 MED ORDER — CEFAZOLIN SODIUM-DEXTROSE 2-4 GM/100ML-% IV SOLN
INTRAVENOUS | Status: AC
  Filled 2020-01-18: qty 100

## 2020-01-18 MED ORDER — IOHEXOL 300 MG/ML  SOLN
100.0000 mL | Freq: Once | INTRAMUSCULAR | Status: AC | PRN
  Administered 2020-01-18: 03:00:00 100 mL via INTRAVENOUS

## 2020-01-18 MED ORDER — MIDAZOLAM HCL 2 MG/2ML IJ SOLN
INTRAMUSCULAR | Status: AC
  Filled 2020-01-18: qty 6

## 2020-01-18 MED ORDER — LACTATED RINGERS IV SOLN: INTRAVENOUS | Status: DC | PRN

## 2020-01-18 MED ORDER — TETANUS-DIPHTH-ACELL PERTUSSIS 5-2.5-18.5 LF-MCG/0.5 IM SUSP
0.5000 mL | Freq: Once | INTRAMUSCULAR | Status: AC
  Administered 2020-01-18: 06:00:00 0.5 mL via INTRAMUSCULAR
  Filled 2020-01-18: qty 0.5

## 2020-01-18 MED ORDER — LIDOCAINE 2% (20 MG/ML) 5 ML SYRINGE
INTRAMUSCULAR | Status: AC
  Filled 2020-01-18: qty 10

## 2020-01-18 MED ORDER — DOCUSATE SODIUM 100 MG PO CAPS
100.0000 mg | ORAL_CAPSULE | Freq: Two times a day (BID) | ORAL | Status: DC
  Administered 2020-01-20 – 2020-01-21 (×2): 100 mg via ORAL
  Filled 2020-01-18 (×4): qty 1

## 2020-01-18 MED ORDER — ONDANSETRON 4 MG PO TBDP: 4.0000 mg | ORAL_TABLET | Freq: Four times a day (QID) | ORAL | Status: DC | PRN

## 2020-01-18 MED ORDER — ETOMIDATE 2 MG/ML IV SOLN
INTRAVENOUS | Status: AC | PRN
  Administered 2020-01-18: 20 mg via INTRAVENOUS

## 2020-01-18 MED ORDER — POTASSIUM CHLORIDE 10 MEQ/100ML IV SOLN
10.0000 meq | INTRAVENOUS | Status: AC
  Administered 2020-01-18 (×4): 10 meq via INTRAVENOUS
  Filled 2020-01-18 (×4): qty 100

## 2020-01-18 MED ORDER — PROPOFOL 1000 MG/100ML IV EMUL
5.0000 ug/kg/min | INTRAVENOUS | Status: DC
  Administered 2020-01-18: 5 ug/kg/min via INTRAVENOUS

## 2020-01-18 MED ORDER — BUPIVACAINE HCL (PF) 0.25 % IJ SOLN
INTRAMUSCULAR | Status: AC
  Filled 2020-01-18: qty 30

## 2020-01-18 MED ORDER — MIDAZOLAM HCL 5 MG/5ML IJ SOLN
INTRAMUSCULAR | Status: DC | PRN
  Administered 2020-01-18: 2 mg via INTRAVENOUS

## 2020-01-18 MED ORDER — CHLORHEXIDINE GLUCONATE 0.12% ORAL RINSE (MEDLINE KIT)
15.0000 mL | Freq: Two times a day (BID) | OROMUCOSAL | Status: DC
  Administered 2020-01-18 – 2020-01-21 (×3): 15 mL via OROMUCOSAL

## 2020-01-18 MED ORDER — EPHEDRINE 5 MG/ML INJ
INTRAVENOUS | Status: AC
  Filled 2020-01-18: qty 20

## 2020-01-18 SURGICAL SUPPLY — 81 items
ANCH SUT 1 SHRT SM RGD INSRTR (Anchor) IMPLANT
ANCH SUT 1.4 1 LD SFT TIS (Anchor) ×2 IMPLANT
ANCHOR JUGGERKNOT SZ1 (Anchor) ×3 IMPLANT
ANCHOR SUT 1.45 SZ 1 SHORT (Anchor) IMPLANT
APL PRP STRL LF DISP 70% ISPRP (MISCELLANEOUS) ×2
BIT DRILL 1.1X3.5 QK RELEA (DRILL) ×1 IMPLANT
BIT DRILL 1.1X3.5 QUICK RELEAS (DRILL) ×4
BIT DRILL 2X3.5 HAND QK RELEAS (BIT) ×1 IMPLANT
BIT DRILL 2X3.5 QUICK RELEASE (BIT) ×4
BIT DRILL MINI LNG ACUTRAK 2 (BIT) IMPLANT
BNDG CMPR 9X4 STRL LF SNTH (GAUZE/BANDAGES/DRESSINGS)
BNDG ELASTIC 3X5.8 VLCR STR LF (GAUZE/BANDAGES/DRESSINGS) ×4 IMPLANT
BNDG ELASTIC 4X5.8 VLCR STR LF (GAUZE/BANDAGES/DRESSINGS) ×6 IMPLANT
BNDG ESMARK 4X9 LF (GAUZE/BANDAGES/DRESSINGS) IMPLANT
BNDG GAUZE ELAST 4 BULKY (GAUZE/BANDAGES/DRESSINGS) ×10 IMPLANT
Biomet JuggarKnot Soft Anchors IMPLANT
CAP PIN PROTECTOR ORTHO WHT (CAP) ×4 IMPLANT
CHLORAPREP W/TINT 26 (MISCELLANEOUS) ×4 IMPLANT
CORD BIPOLAR FORCEPS 12FT (ELECTRODE) ×4 IMPLANT
COVER SURGICAL LIGHT HANDLE (MISCELLANEOUS) ×4 IMPLANT
COVER WAND RF STERILE (DRAPES) ×4 IMPLANT
CUFF TOURN SGL QUICK 18X4 (TOURNIQUET CUFF) ×4 IMPLANT
CUFF TOURN SGL QUICK 24 (TOURNIQUET CUFF)
CUFF TRNQT CYL 24X4X16.5-23 (TOURNIQUET CUFF) IMPLANT
DRAPE U-SHAPE 47X51 STRL (DRAPES) ×4 IMPLANT
DRILL MINI LNG ACUTRAK 2 (BIT) ×4
DRSG XEROFORM 1X8 (GAUZE/BANDAGES/DRESSINGS) ×3 IMPLANT
GAUZE SPONGE 4X4 12PLY STRL (GAUZE/BANDAGES/DRESSINGS) ×4 IMPLANT
GAUZE SPONGE 4X4 12PLY STRL LF (GAUZE/BANDAGES/DRESSINGS) ×2 IMPLANT
GAUZE XEROFORM 5X9 LF (GAUZE/BANDAGES/DRESSINGS) ×4 IMPLANT
GLOVE BIOGEL PI IND STRL 8 (GLOVE) ×2 IMPLANT
GLOVE BIOGEL PI INDICATOR 8 (GLOVE) ×2
GLOVE SURG SYN 7.5  E (GLOVE) ×2
GLOVE SURG SYN 7.5 E (GLOVE) ×2 IMPLANT
GLOVE SURG SYN 7.5 PF PI (GLOVE) ×2 IMPLANT
GOWN STRL REUS W/ TWL LRG LVL3 (GOWN DISPOSABLE) ×2 IMPLANT
GOWN STRL REUS W/TWL LRG LVL3 (GOWN DISPOSABLE) ×4
GUIDEWIRE ORTH 6X062XTROC NS (WIRE) IMPLANT
GUIDEWIRE ORTHO MINI ACTK .045 (WIRE) ×2 IMPLANT
K-WIRE .062 (WIRE) ×4
KIT BASIN OR (CUSTOM PROCEDURE TRAY) ×4 IMPLANT
KIT TURNOVER KIT B (KITS) ×4 IMPLANT
MANIFOLD NEPTUNE II (INSTRUMENTS) ×4 IMPLANT
NDL HYPO 25GX1X1/2 BEV (NEEDLE) IMPLANT
NEEDLE HYPO 25GX1X1/2 BEV (NEEDLE) IMPLANT
NS IRRIG 1000ML POUR BTL (IV SOLUTION) ×4 IMPLANT
PACK ORTHO EXTREMITY (CUSTOM PROCEDURE TRAY) ×4 IMPLANT
PAD ARMBOARD 7.5X6 YLW CONV (MISCELLANEOUS) ×4 IMPLANT
PAD CAST 4YDX4 CTTN HI CHSV (CAST SUPPLIES) ×2 IMPLANT
PADDING CAST ABS 3INX4YD NS (CAST SUPPLIES) ×2
PADDING CAST ABS COTTON 3X4 (CAST SUPPLIES) IMPLANT
PADDING CAST COTTON 4X4 STRL (CAST SUPPLIES) ×4
PLATE BONE 3H T .8MM NS (Plate) IMPLANT
PLATE BONE T .8MM NS (Plate) ×4 IMPLANT
PLATE TACK (Plate) ×4 IMPLANT
SCREW ACUTRAK 2 MINI 16MM (Screw) ×3 IMPLANT
SCREW BONE LAG 1.5X11MM HEXA (Screw) ×1 IMPLANT
SCREW BONE LOCK 2.3X14MM HEXA (Screw) IMPLANT
SCREW LAG HEXALOBE 1.5X11 (Screw) ×4 IMPLANT
SCREW LOCK 2.3X14 (Screw) ×8 IMPLANT
SCREW LOCK HEX MULTI 2.3X10 (Screw) ×2 IMPLANT
SCREW LOCK HEX MULTI 2.3X11 (Screw) ×6 IMPLANT
SCREW LOCK HEX MULTI 2.3X13 (Screw) ×4 IMPLANT
SCREW NONLOCK TI 2.3X11 (Screw) ×3 IMPLANT
SCREW NONLOCKING 1.5X8MM (Screw) ×2 IMPLANT
SET CYSTO W/LG BORE CLAMP LF (SET/KITS/TRAYS/PACK) ×4 IMPLANT
SET JUGGERKNOT DISP 1.4MM (SET/KITS/TRAYS/PACK) ×3 IMPLANT
SPONGE LAP 4X18 RFD (DISPOSABLE) ×4 IMPLANT
SUT SUPRAMID 3-0 (SUTURE) ×8 IMPLANT
SWAB CULTURE ESWAB REG 1ML (MISCELLANEOUS) IMPLANT
SYR CONTROL 10ML LL (SYRINGE) IMPLANT
TACK PLATE ORTHO 40MM (Plate) ×1 IMPLANT
TOWEL GREEN STERILE (TOWEL DISPOSABLE) ×4 IMPLANT
TOWEL GREEN STERILE FF (TOWEL DISPOSABLE) ×4 IMPLANT
TUBE CONNECTING 12'X1/4 (SUCTIONS) ×1
TUBE CONNECTING 12X1/4 (SUCTIONS) ×3 IMPLANT
TUBING TUR DISP (UROLOGICAL SUPPLIES) IMPLANT
UNDERPAD 30X30 (UNDERPADS AND DIAPERS) ×4 IMPLANT
UNDERPAD 30X36 HEAVY ABSORB (UNDERPADS AND DIAPERS) ×4 IMPLANT
WATER STERILE IRR 1000ML POUR (IV SOLUTION) ×4 IMPLANT
YANKAUER SUCT BULB TIP NO VENT (SUCTIONS) ×4 IMPLANT
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
## 2020-01-18 NOTE — Progress Notes (Signed)
Attempted to call patient's mother again with the number listed in chart. No answer.

## 2020-01-18 NOTE — ED Notes (Signed)
Pt to c-t [id number]  Returned from c-t at 0315  Pt has not moved since he was intubated   og tube placed number 14  Xray called to do one view of the abd for og placement

## 2020-01-18 NOTE — ED Notes (Signed)
Bed assigned at 0515  When I called to try and give report was told that they did not know that they were getting a patient  rn took report
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
## 2020-01-18 NOTE — Progress Notes (Signed)
Mother at bedside. Phone number updated in chart. The previously listed number was the patient's number.

## 2020-01-18 NOTE — ED Notes (Signed)
Very agitated  Propofol bolus given
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
## 2020-01-18 NOTE — Progress Notes (Signed)
RT NOTE: RT transported patient from 4N22 to CT and back with RN. No complications. RT will continue to monitor.

## 2020-01-18 NOTE — ED Notes (Addendum)
Unable to find out if family has been called  No id in cut off clothes.  ?? Maybe [facility] is calling  One shoe one sock cut off  Pants pockets empty all bagged  To go up with the pt
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
## 2020-01-18 NOTE — ED Notes (Signed)
Pt restless, agitated; continuing to pull toward tubing. Bandage noted to L wrist with active bleeding through bandage

## 2020-01-18 NOTE — Anesthesia Procedure Notes (Signed)
Date/Time: 01/18/2020 6:33 PM Performed by: Gwenyth Allegra, CRNA Pre-anesthesia Checklist: Patient identified, Emergency Drugs available, Suction available, Patient being monitored and Timeout performed Patient Re-evaluated:Patient Re-evaluated prior to induction Oxygen Delivery Method: Circle system utilized
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
## 2020-01-18 NOTE — ED Notes (Signed)
Dr Donell Beers made aware of the pts bp and heartbeat  Orders given

## 2020-01-18 NOTE — ED Notes (Signed)
Answering questions asked by dr Clayborne Dana until the pt was intubated

## 2020-01-18 NOTE — ED Notes (Signed)
One attempt made to find family message left on the phone number listed in his chart  No answer message left on answering machine for someone to contact us  Its maybe the pts own home number

## 2020-01-18 NOTE — Progress Notes (Signed)
Patient back to unit at 2245. RN notified mother per request.

## 2020-01-18 NOTE — Progress Notes (Signed)
Attempted to call mother with number listed in chart. No answer at this time. Will continue to attempt reaching her.
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
## 2020-01-18 NOTE — Anesthesia Preprocedure Evaluation (Addendum)
Anesthesia Evaluation  Patient identified by MRN, date of birth, ID band Patient unresponsive    Reviewed: Allergy & Precautions, Patient's Chart, lab work & pertinent test results, Unable to perform ROS - Chart review only  Airway Mallampati: Intubated       Dental  (+) Missing   Pulmonary neg pulmonary ROS,    breath sounds clear to auscultation       Cardiovascular negative cardio ROS   Rhythm:Regular Rate:Tachycardia     Neuro/Psych negative neurological ROS  negative psych ROS   GI/Hepatic negative GI ROS, Neg liver ROS,   Endo/Other  negative endocrine ROS  Renal/GU negative Renal ROS     Musculoskeletal negative musculoskeletal ROS (+)   Abdominal   Peds  Hematology negative hematology ROS (+)   Anesthesia Other Findings   Reproductive/Obstetrics                           Anesthesia Physical Anesthesia Plan  ASA: II and emergent  Anesthesia Plan: General   Post-op Pain Management:    Induction: Inhalational  PONV Risk Score and Plan: 3 and Ondansetron, Dexamethasone and Midazolam  Airway Management Planned: Oral ETT  Additional Equipment: None  Intra-op Plan:   Post-operative Plan: Post-operative intubation/ventilation  Informed Consent: I have reviewed the patients History and Physical, chart, labs and discussed the procedure including the risks, benefits and alternatives for the proposed anesthesia with the patient or authorized representative who has indicated his/her understanding and acceptance.       Plan Discussed with: CRNA  Anesthesia Plan Comments:       Anesthesia Quick Evaluation

## 2020-01-19 ENCOUNTER — Inpatient Hospital Stay (HOSPITAL_COMMUNITY): Payer: No Typology Code available for payment source

## 2020-01-19 LAB — CBC
HCT: 30.7 % — ABNORMAL LOW (ref 39.0–52.0)
Hemoglobin: 10.6 g/dL — ABNORMAL LOW (ref 13.0–17.0)
MCH: 32.1 pg (ref 26.0–34.0)
MCHC: 34.5 g/dL (ref 30.0–36.0)
MCV: 93 fL (ref 80.0–100.0)
Platelets: 191 10*3/uL (ref 150–400)
RBC: 3.3 MIL/uL — ABNORMAL LOW (ref 4.22–5.81)
RDW: 13.9 % (ref 11.5–15.5)
WBC: 8.3 10*3/uL (ref 4.0–10.5)
nRBC: 0 % (ref 0.0–0.2)

## 2020-01-19 LAB — BASIC METABOLIC PANEL
Anion gap: 15 (ref 5–15)
BUN: 12 mg/dL (ref 6–20)
CO2: 17 mmol/L — ABNORMAL LOW (ref 22–32)
Calcium: 8.3 mg/dL — ABNORMAL LOW (ref 8.9–10.3)
Chloride: 105 mmol/L (ref 98–111)
Creatinine, Ser: 1.27 mg/dL — ABNORMAL HIGH (ref 0.61–1.24)
GFR calc Af Amer: >60 mL/min (ref >60)
GFR calc non Af Amer: >60 mL/min (ref >60)
Glucose, Bld: 69 mg/dL — ABNORMAL LOW (ref 70–99)
Potassium: 4.1 mmol/L (ref 3.5–5.1)
Sodium: 137 mmol/L (ref 135–145)

## 2020-01-19 LAB — MAGNESIUM: Magnesium: 1.7 mg/dL (ref 1.7–2.4)

## 2020-01-19 LAB — PHOSPHORUS: Phosphorus: 2.6 mg/dL (ref 2.5–4.6)

## 2020-01-19 LAB — TRIGLYCERIDES: Triglycerides: 144 mg/dL (ref <150)

## 2020-01-19 MED ORDER — FENTANYL CITRATE (PF) 100 MCG/2ML IJ SOLN
50.0000 ug | INTRAMUSCULAR | Status: DC | PRN
  Administered 2020-01-19 – 2020-01-20 (×11): 50 ug via INTRAVENOUS
  Filled 2020-01-19 (×11): qty 2

## 2020-01-19 MED ORDER — OXYCODONE HCL 5 MG PO TABS
5.0000 mg | ORAL_TABLET | ORAL | Status: DC | PRN
  Administered 2020-01-19 – 2020-01-20 (×5): 10 mg via ORAL
  Filled 2020-01-19 (×5): qty 2

## 2020-01-19 MED ORDER — METHOCARBAMOL 500 MG PO TABS: 750.0000 mg | ORAL_TABLET | Freq: Three times a day (TID) | ORAL | Status: DC | PRN

## 2020-01-19 MED ORDER — WHITE PETROLATUM EX OINT
TOPICAL_OINTMENT | CUTANEOUS | Status: AC
  Filled 2020-01-19: qty 28.35

## 2020-01-19 MED ORDER — METHOCARBAMOL 500 MG PO TABS
1000.0000 mg | ORAL_TABLET | Freq: Three times a day (TID) | ORAL | Status: DC
  Administered 2020-01-19 – 2020-01-21 (×7): 1000 mg via ORAL
  Filled 2020-01-19 (×7): qty 2
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
## 2020-01-19 NOTE — Progress Notes (Addendum)
Pt presents from OR with Foley Catheter. Charted in hindsight, unable to fully document. Restraints were not on patient upon arrival, d/t nature of the procedure. Reapplied. Unable to assess Restraints at 2000- off unit.

## 2020-01-19 NOTE — Procedures (Signed)
Extubation Procedure Note  Patient Details:   Name: Chris Gibson DOB: 12/15/1990 MRN: [id number]   Airway Documentation:    Vent end date: 01/19/20 Vent end time: 0923   Evaluation  O2 sats: stable throughout Complications: No apparent complications Patient did tolerate procedure well. Bilateral Breath Sounds: Clear   Yes   Patient extubated per MD order. Positive cuff leak. No stridor noted. Patient on 3L [place] tolerating well. Vitals are stable. RN at bedside.  Jarrod Bodkins H Kalynn Declercq 01/19/2020, 9:25 AM

## 2020-01-19 NOTE — Progress Notes (Signed)
   Ortho Hand Progress Note  Subjective: No acute events last night. Patient seen and evaluated earlier this AM. Still intubated on exam.   Objective: Vital signs in last 24 hours: Temp:  [97.6 F (36.4 C)-100.4 F (38 C)] 99.9 F (37.7 C) (02/09 1600) Pulse Rate:  [77-128] 118 (02/09 1500) Resp:  [15-28] 26 (02/09 1500) BP: (103-152)/(60-97) 152/97 (02/09 1440) SpO2:  [94 %-100 %] 94 % (02/09 1500) FiO2 (%):  [40 %] 40 % (02/09 0757)  Intake/Output from previous day: 02/08 0701 - 02/09 0700 In: 4625 [I.V.:3048.8; IV Piggyback:1576.3] Out: 2360 [Urine:2285; Blood:75] Intake/Output this shift: Total I/O In: 325.2 [I.V.:325.2] Out: 600 [Urine:600]  Recent Labs    01/18/20 0256 01/18/20 0323 01/18/20 1322 01/18/20 1700 01/19/20 0433  HGB 16.0 12.9* 12.7* 12.5* 10.6*   Recent Labs    01/18/20 1700 01/19/20 0433  WBC 8.9 8.3  RBC 3.86* 3.30*  HCT 34.8* 30.7*  PLT 223 191   Recent Labs    01/18/20 1700 01/19/20 0433  NA 133* 137  K 4.2 4.1  CL 104 105  CO2 18* 17*  BUN 12 12  CREATININE 1.17 1.27*  GLUCOSE 68* 69*  CALCIUM 8.0* 8.3*   Recent Labs    01/18/20 0241  INR 1.2   Sedated Intubated RRR RUE: Dressings and splint in place. C/d/i. Pin in the thumb visible. Exposed digits wwp with bcr. Unable to assess sensation or motion secondary to intubation VWU:[id number] in place. C/d/i. Exposed digits wwp with bcr. Unable to assess sensation or motion secondary to intubation   Assessment/Plan: Multiple right hand injuries s/p repair POD1 Left forearm and hand laceration s/p closure POD1  - Trauma primary. Appreciate management - Continue with BUE elevation - NWB RUE. WBAT LUE - Continue RUE splint - OK to change LUE dressings as needed - Post op abx - Remainder per primary   Cain Saupe III 01/19/2020, 4:59 PM  [phone]

## 2020-01-19 NOTE — Progress Notes (Signed)
Patient ID: Real D [place], male   DOB: 1991/01/28, 29 y.o.   MRN: [id number] Extubated. Seems to have pretty significant R facial nerve palsy. ? Blunt facial nerve injury. I will ask Dr, Doran Heater to evaluate.  Violeta Gelinas, MD, MPH, FACS Please use [email or web address] to contact on call provider
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
## 2020-01-19 NOTE — Progress Notes (Signed)
Patient ID: Chris Gibson, male   DOB: 1991/02/13, 29 y.o.   MRN: [id number] I met with his mother at the bedside and updated her. CT temporal bones results pending. Transfer to 4NP.  Georganna Skeans, MD, MPH, FACS Please use [email or web address] to contact on call provider

## 2020-01-20 MED ORDER — MORPHINE SULFATE (PF) 2 MG/ML IV SOLN
1.0000 mg | INTRAVENOUS | Status: DC | PRN
  Administered 2020-01-20 – 2020-01-21 (×4): 2 mg via INTRAVENOUS
  Filled 2020-01-20 (×5): qty 1

## 2020-01-20 MED ORDER — FOLIC ACID 1 MG PO TABS
1.0000 mg | ORAL_TABLET | Freq: Every day | ORAL | Status: DC
  Administered 2020-01-20 – 2020-01-21 (×2): 1 mg via ORAL
  Filled 2020-01-20 (×2): qty 1

## 2020-01-20 MED ORDER — THIAMINE HCL 100 MG PO TABS
100.0000 mg | ORAL_TABLET | Freq: Every day | ORAL | Status: DC
  Administered 2020-01-21: 100 mg via ORAL
  Filled 2020-01-20: qty 1

## 2020-01-20 MED ORDER — OXYCODONE HCL 5 MG PO TABS
10.0000 mg | ORAL_TABLET | ORAL | Status: DC | PRN
  Administered 2020-01-20 – 2020-01-21 (×3): 15 mg via ORAL
  Filled 2020-01-20 (×3): qty 3
  Filled 2020-01-20: qty 2

## 2020-01-20 MED ORDER — ARTIFICIAL TEARS OPHTHALMIC OINT
TOPICAL_OINTMENT | OPHTHALMIC | Status: DC
  Administered 2020-01-20: 1 via OPHTHALMIC
  Filled 2020-01-20 (×2): qty 3.5

## 2020-01-20 MED ORDER — ERYTHROMYCIN 5 MG/GM OP OINT
TOPICAL_OINTMENT | Freq: Every day | OPHTHALMIC | Status: DC
  Filled 2020-01-20: qty 3.5

## 2020-01-20 MED ORDER — POLYETHYLENE GLYCOL 3350 17 G PO PACK
17.0000 g | PACK | Freq: Every day | ORAL | Status: DC
  Administered 2020-01-21: 10:00:00 17 g via ORAL
  Filled 2020-01-20 (×2): qty 1

## 2020-01-20 MED ORDER — PREDNISONE 50 MG PO TABS
60.0000 mg | ORAL_TABLET | Freq: Every day | ORAL | Status: DC
  Administered 2020-01-20 – 2020-01-21 (×2): 60 mg via ORAL
  Filled 2020-01-20 (×2): qty 1

## 2020-01-20 MED ORDER — DIPHENHYDRAMINE HCL 50 MG/ML IJ SOLN
25.0000 mg | Freq: Four times a day (QID) | INTRAMUSCULAR | Status: DC | PRN
  Administered 2020-01-20: 25 mg via INTRAVENOUS
  Filled 2020-01-20: qty 1

## 2020-01-20 MED ORDER — ACETAMINOPHEN 325 MG PO TABS
650.0000 mg | ORAL_TABLET | Freq: Four times a day (QID) | ORAL | Status: DC
  Administered 2020-01-20 – 2020-01-21 (×5): 650 mg via ORAL
  Filled 2020-01-20 (×6): qty 2

## 2020-01-20 MED ORDER — OXYCODONE HCL 5 MG PO TABS: 5.0000 mg | ORAL_TABLET | ORAL | Status: DC | PRN

## 2020-01-20 NOTE — Progress Notes (Signed)
   Ortho Hand Progress Note  Subjective: No acute events last night. Extubated yesterday. States he is having some paresthesia to the fingers, primarily the right and small fingers. Denies pain on the left    Objective: Vital signs in last 24 hours: Temp:  [98 F (36.7 C)-99.9 F (37.7 C)] 98.6 F (37 C) (02/10 0449) Pulse Rate:  [79-133] 93 (02/10 0449) Resp:  [15-28] 16 (02/10 0449) BP: (104-155)/(63-120) 145/86 (02/10 0449) SpO2:  [92 %-100 %] 97 % (02/10 0449) Weight:  [60.1 kg] 60.1 kg (02/10 0047)  Intake/Output from previous day: 02/09 0701 - 02/10 0700 In: 1234.5 [P.O.:240; I.V.:794.4; IV Piggyback:200.1] Out: 600 [Urine:600] Intake/Output this shift: Total I/O In: 120 [P.O.:120] Out: -   Recent Labs    01/18/20 0256 01/18/20 0323 01/18/20 1322 01/18/20 1700 01/19/20 0433  HGB 16.0 12.9* 12.7* 12.5* 10.6*   Recent Labs    01/18/20 1700 01/19/20 0433  WBC 8.9 8.3  RBC 3.86* 3.30*  HCT 34.8* 30.7*  PLT 223 191   Recent Labs    01/18/20 1700 01/19/20 0433  NA 133* 137  K 4.2 4.1  CL 104 105  CO2 18* 17*  BUN 12 12  CREATININE 1.17 1.27*  GLUCOSE 68* 69*  CALCIUM 8.0* 8.3*   Recent Labs    01/18/20 0241  INR 1.2   aaox3 nad resp nonlabored rrr RUE: Splint and dressings adjusted. Exposed digits wwp with bcr. Decreased sensation to the index finger. Intact sensation to the thumb and middle fingers. Abnormal sensation to the ring and small fingers. Able to gently flex and extend the middle, ring and small fingers. Thumb and index fingers pinned. Tenderness in the forearm but compartments soft and compressible  LUE: Dressings intact. C/d/i. Full motion of the digits which is pain free. Grossly intact sensation. Fingers wwp with bcr.   Assessment/Plan: Multiple right hand injuries s/p repair POD2 Left forearm and hand laceration s/p closure POD2  - Trauma primary. Appreciate management - Continue with BUE elevation - NWB RUE. WBAT LUE -  Continue RUE splint. OT for custom fitting splint as his post surgical dressing is not fitting well.  - OK to change LUE dressings as needed - Post op abx - Remainder per primary  OK for d/c from hand standpoint with close follow up with me in 1 week after d/c  Cain Saupe III 01/20/2020, 8:05 AM  [phone]

## 2020-01-20 NOTE — Progress Notes (Signed)
PT Cancellation Note  Patient Details Name: Chris Gibson MRN: [id number] DOB: 21-Feb-1991   Cancelled Treatment:    Reason Eval/Treat Not Completed: Patient declined, no reason specified Pt in bed and declined participating in therapy at this time. PT will continue to follow acutely.    Erline Levine, PTA Acute Rehabilitation Services Pager: [phone] Office: [phone]     01/20/2020, 2:26 PM
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
## 2020-01-20 NOTE — Discharge Instructions (Addendum)
Regarding facial nerve palsy: -Tape right eyelid closed at night.  Take a clear piece of medical tape and dragged the upper lid at the medial corner onto the right malar eminence.  Do not apply any gauze or eye patches to the eye. -Saline eyedrops every 2 hours when awake. -Erythromycin eye ointment at night prior to taping the right eye. -Take prednisone as prescribed for 1 week -Ice packs to right face as tolerated. -Follow-up with Dr. Doran Heater in clinic in 7 to 10 days.   Regarding hand injuries: - Elevate both hands to decrease swelling - Nonweightbearing to right upper extremity, Weightbearing as tolerated to left upper extremity - OK to change left upper extremity dressing as needed - Keep right hand splint clean and dry

## 2020-01-20 NOTE — Plan of Care (Signed)
  Problem: Education: Goal: Knowledge of General Education information will improve Description: Including pain rating scale, medication(s)/side effects and non-pharmacologic comfort measures Outcome: Progressing   Problem: Health Behavior/Discharge Planning: Goal: Ability to manage health-related needs will improve Outcome: Progressing   Problem: Pain Managment: Goal: General experience of comfort will improve Outcome: Progressing   
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
## 2020-01-20 NOTE — Progress Notes (Signed)
Orthopedic Tech Progress Note Patient Details:  Chris Gibson 08-27-1991 [id number] Patient was not ready to apply the sling. I did let him know its for comfort so whenever his up he'll need to wear the shoulder sling. Ortho Devices Type of Ortho Device: Sling immobilizer Ortho Device/Splint Location: RUE Ortho Device/Splint Interventions: Ordered, Other (comment)   Post Interventions Patient Tolerated: Other (comment) Instructions Provided: Adjustment of device, Care of device   Donald Pore 01/20/2020, 8:13 AM
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
## 2020-01-20 NOTE — Progress Notes (Signed)
Orthopedic Tech Progress Note Patient Details:  Chris Gibson [birth year] [id number]  Ortho Devices Type of Ortho Device: Arm sling Ortho Device/Splint Location: right replacement Ortho Device/Splint Interventions: Application   Post Interventions Patient Tolerated: Well Instructions Provided: Care of device   Saul Fordyce 01/20/2020, 12:52 PM

## 2020-01-20 NOTE — Progress Notes (Deleted)
OT Splint Fabrication  Surgical dressing removed and cleaning hand with sterile water. Xeoform petrolatum dressing placed over dorsum of hand stitches, 1st, 2nd and 3rd digit. Drainage noted 3rd digit PIP area with active bloody drainage. Gauze applied to 1st, 2nd and 3rd digit then wrapping the hand and wrist. Fabricated resting hand splint from blue ezform material with stabilization of Thumb in abduction , digit extension to patients tolerance with edema. Splint including the tip of the pin in the 1st digit to protect with patient's activities. Pt with extra splint straps and velcro provided in patient care bag with splint instructions. Pt educated on edema management with elevation.    Chris Gibson, OTR/L  Acute Rehabilitation Services Pager: [phone] Office: [phone]

## 2020-01-20 NOTE — Progress Notes (Signed)
Pt is complaining of numbness and tingling in fingers and states he believes  his wrap  " too tight" capillary refill is less than 3 seconds and fingers are warm to the touch.  MD notified  Given verbal order to loosen ace bandage around splint.   will continue to monitor
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
## 2020-01-20 NOTE — Progress Notes (Signed)
pts current vitals  BP (!) 148/120 (BP Location: Left Arm)   Pulse (!) 130   Temp 98 F (36.7 C) (Oral)   Resp 18   Ht 5\' 5"  (1.651 m)   Wt 60.1 kg Comment: scale c  SpO2 98%   BMI 22.05 kg/m   pts states pan is a 10 out of 10 Prn pain medication given  Will reassess

## 2020-01-20 NOTE — Plan of Care (Signed)

## 2020-01-21 LAB — CBC
HCT: 30.9 % — ABNORMAL LOW (ref 39.0–52.0)
Hemoglobin: 10.8 g/dL — ABNORMAL LOW (ref 13.0–17.0)
MCH: 32.1 pg (ref 26.0–34.0)
MCHC: 35 g/dL (ref 30.0–36.0)
MCV: 92 fL (ref 80.0–100.0)
Platelets: 196 10*3/uL (ref 150–400)
RBC: 3.36 MIL/uL — ABNORMAL LOW (ref 4.22–5.81)
RDW: 13.7 % (ref 11.5–15.5)
WBC: 13.6 10*3/uL — ABNORMAL HIGH (ref 4.0–10.5)
nRBC: 0 % (ref 0.0–0.2)

## 2020-01-21 LAB — BASIC METABOLIC PANEL
Anion gap: 13 (ref 5–15)
BUN: 8 mg/dL (ref 6–20)
CO2: 28 mmol/L (ref 22–32)
Calcium: 9.4 mg/dL (ref 8.9–10.3)
Chloride: 100 mmol/L (ref 98–111)
Creatinine, Ser: 0.99 mg/dL (ref 0.61–1.24)
GFR calc Af Amer: >60 mL/min (ref >60)
GFR calc non Af Amer: >60 mL/min (ref >60)
Glucose, Bld: 137 mg/dL — ABNORMAL HIGH (ref 70–99)
Potassium: 4.2 mmol/L (ref 3.5–5.1)
Sodium: 141 mmol/L (ref 135–145)

## 2020-01-21 LAB — MAGNESIUM: Magnesium: 2 mg/dL (ref 1.7–2.4)

## 2020-01-21 LAB — PHOSPHORUS: Phosphorus: 2.4 mg/dL — ABNORMAL LOW (ref 2.5–4.6)

## 2020-01-21 LAB — TRIGLYCERIDES: Triglycerides: 120 mg/dL (ref <150)

## 2020-01-21 MED ORDER — OXYCODONE HCL 10 MG PO TABS: 10.0000 mg | ORAL_TABLET | ORAL | 0 refills | Status: DC | PRN

## 2020-01-21 MED ORDER — GABAPENTIN 100 MG PO CAPS
100.0000 mg | ORAL_CAPSULE | Freq: Three times a day (TID) | ORAL | Status: DC
  Administered 2020-01-21 (×2): 100 mg via ORAL
  Filled 2020-01-21 (×2): qty 1

## 2020-01-21 MED ORDER — ACETAMINOPHEN 325 MG PO TABS: 650.0000 mg | ORAL_TABLET | Freq: Four times a day (QID) | ORAL | Status: DC

## 2020-01-21 MED ORDER — PREDNISONE 20 MG PO TABS: 60.0000 mg | ORAL_TABLET | Freq: Every day | ORAL | 0 refills | Status: DC

## 2020-01-21 MED ORDER — DOCUSATE SODIUM 100 MG PO CAPS: 100.0000 mg | ORAL_CAPSULE | Freq: Two times a day (BID) | ORAL | 0 refills | Status: AC

## 2020-01-21 MED ORDER — MORPHINE SULFATE (PF) 2 MG/ML IV SOLN
1.0000 mg | Freq: Three times a day (TID) | INTRAVENOUS | Status: DC | PRN
  Administered 2020-01-21: 11:00:00 2 mg via INTRAVENOUS
  Filled 2020-01-21: qty 1

## 2020-01-21 MED ORDER — ERYTHROMYCIN 5 MG/GM OP OINT: TOPICAL_OINTMENT | Freq: Every day | OPHTHALMIC | 0 refills | Status: DC

## 2020-01-21 MED ORDER — POLYETHYLENE GLYCOL 3350 17 G PO PACK: 17.0000 g | PACK | Freq: Every day | ORAL | 0 refills | Status: AC

## 2020-01-21 MED ORDER — GABAPENTIN 100 MG PO CAPS: 100.0000 mg | ORAL_CAPSULE | Freq: Three times a day (TID) | ORAL | 0 refills | Status: AC

## 2020-01-21 MED ORDER — ARTIFICIAL TEARS OPHTHALMIC OINT: TOPICAL_OINTMENT | OPHTHALMIC | 0 refills | Status: AC

## 2020-01-21 MED ORDER — METHOCARBAMOL 500 MG PO TABS: 1000.0000 mg | ORAL_TABLET | Freq: Three times a day (TID) | ORAL | 0 refills | Status: AC | PRN

## 2020-01-21 MED FILL — METHOCARBAMOL 500 MG TABS: 500 | 10 days supply | Qty: 60 | Fill #0

## 2020-01-21 MED FILL — GABAPENTIN 100 MG CAPSULE: 100 | 30 days supply | Qty: 90 | Fill #0

## 2020-01-21 MED FILL — ERYTHROMYCIN EYE OINTMENT: 5 | 3 days supply | Qty: 4 | Fill #0

## 2020-01-21 MED FILL — oxyCODONE HCL 10 MG TABS: 10 | 4 days supply | Qty: 30 | Fill #0

## 2020-01-21 MED FILL — predniSONE 20 MG TABS: 20 | 2 days supply | Qty: 6 | Fill #0
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
## 2020-01-21 NOTE — TOC Transition Note (Signed)
Transition of Care [facility]) - CM/SW Discharge Note   Patient Details  Name: Iniko D [place] MRN: [id number] Date of Birth: 1991/05/28  [facility]) CM/SW Contact:  Glennon Mac, RN Phone Number: 01/21/2020, 2:59 PM   Clinical Narrative:   Pt medically stable for discharge home today with mother.  DC Rx sent to [facility] pharmacy to be filled with [facility] letter and copay override.  Pt to follow up at [facility] for OP rehab, as recommended by therapy.      Final next level of care: OP Rehab Barriers to Discharge: Continued Medical Work up            Discharge Plan and Services   Discharge Planning Services: CM Consult, MATCH Program, Medication Assistance                                 Social Determinants of Health (SDOH) Interventions     Readmission Risk Interventions No flowsheet data found.  Quintella Baton, RN, BSN  Trauma/Neuro ICU Case Manager [phone]
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
## 2020-01-22 ENCOUNTER — Encounter: Payer: Self-pay | Admitting: *Deleted

## 2020-01-25 ENCOUNTER — Encounter (HOSPITAL_COMMUNITY): Payer: Self-pay | Admitting: Emergency Medicine

## 2020-01-25 ENCOUNTER — Other Ambulatory Visit: Payer: Self-pay

## 2020-01-25 ENCOUNTER — Emergency Department (HOSPITAL_COMMUNITY)
Admission: EM | Admit: 2020-01-25 | Discharge: 2020-01-25 | Disposition: A | Discharge status: Home / Self Care | Payer: Self-pay | Attending: Emergency Medicine | Admitting: Emergency Medicine

## 2020-01-25 DIAGNOSIS — Z79899 Other long term (current) drug therapy: Secondary | ICD-10-CM | POA: Insufficient documentation

## 2020-01-25 DIAGNOSIS — Z791 Long term (current) use of non-steroidal anti-inflammatories (NSAID): Secondary | ICD-10-CM | POA: Insufficient documentation

## 2020-01-25 DIAGNOSIS — R200 Anesthesia of skin: Secondary | ICD-10-CM | POA: Insufficient documentation

## 2020-01-25 DIAGNOSIS — F17210 Nicotine dependence, cigarettes, uncomplicated: Secondary | ICD-10-CM | POA: Diagnosis not present
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
## 2020-01-25 NOTE — Discharge Instructions (Addendum)
You need to follow-up with ear nose and throat specialist who saw you in the hospital.  I called the office and explained your situation.  Phone number given.  If you do not hear by mid afternoon, I would call the above number.  No testing will be done today.
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
## 2020-01-26 ENCOUNTER — Telehealth (HOSPITAL_COMMUNITY): Payer: Self-pay

## 2020-01-26 NOTE — Telephone Encounter (Signed)
s/w pt he understands not to come tomorrow and to go see Dr Roney Mans. Dr. Roney Mans will send new order after he sees this patient tomorrow

## 2020-01-27 ENCOUNTER — Ambulatory Visit (HOSPITAL_COMMUNITY): Payer: Self-pay

## 2020-02-03 ENCOUNTER — Emergency Department (HOSPITAL_COMMUNITY)
Admission: EM | Admit: 2020-02-03 | Discharge: 2020-02-03 | Disposition: A | Discharge status: Home / Self Care | Payer: No Typology Code available for payment source | Attending: Emergency Medicine | Admitting: Emergency Medicine

## 2020-02-03 ENCOUNTER — Encounter (HOSPITAL_COMMUNITY): Payer: Self-pay | Admitting: Emergency Medicine

## 2020-02-03 ENCOUNTER — Other Ambulatory Visit: Payer: Self-pay

## 2020-02-03 ENCOUNTER — Emergency Department (HOSPITAL_COMMUNITY): Payer: No Typology Code available for payment source

## 2020-02-03 DIAGNOSIS — Z79899 Other long term (current) drug therapy: Secondary | ICD-10-CM | POA: Insufficient documentation

## 2020-02-03 DIAGNOSIS — M25511 Pain in right shoulder: Secondary | ICD-10-CM | POA: Insufficient documentation

## 2020-02-03 DIAGNOSIS — M25512 Pain in left shoulder: Secondary | ICD-10-CM

## 2020-02-03 DIAGNOSIS — F17210 Nicotine dependence, cigarettes, uncomplicated: Secondary | ICD-10-CM | POA: Insufficient documentation

## 2020-02-03 MED ORDER — PREDNISONE 50 MG PO TABS: 50.0000 mg | ORAL_TABLET | Freq: Every day | ORAL | 0 refills | Status: AC

## 2020-02-03 MED ORDER — DICLOFENAC SODIUM 50 MG PO TBEC: 50.0000 mg | DELAYED_RELEASE_TABLET | Freq: Two times a day (BID) | ORAL | 0 refills | Status: AC

## 2020-02-03 NOTE — Discharge Instructions (Addendum)
See your Orthopaedist as scheduled
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
## 2020-02-03 NOTE — ED Notes (Signed)
Patient transported to x-ray. ?
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
## 2020-02-11 ENCOUNTER — Ambulatory Visit (HOSPITAL_COMMUNITY): Payer: Self-pay | Attending: Physician Assistant | Admitting: Occupational Therapy

## 2020-02-11 ENCOUNTER — Encounter (HOSPITAL_COMMUNITY): Payer: Self-pay | Admitting: Occupational Therapy

## 2020-02-11 ENCOUNTER — Other Ambulatory Visit: Payer: Self-pay

## 2020-02-11 DIAGNOSIS — M79641 Pain in right hand: Secondary | ICD-10-CM | POA: Insufficient documentation

## 2020-02-11 DIAGNOSIS — M25531 Pain in right wrist: Secondary | ICD-10-CM | POA: Insufficient documentation

## 2020-02-11 DIAGNOSIS — R29898 Other symptoms and signs involving the musculoskeletal system: Secondary | ICD-10-CM | POA: Insufficient documentation

## 2020-02-11 DIAGNOSIS — M25641 Stiffness of right hand, not elsewhere classified: Secondary | ICD-10-CM | POA: Insufficient documentation

## 2020-02-11 DIAGNOSIS — M25631 Stiffness of right wrist, not elsewhere classified: Secondary | ICD-10-CM | POA: Insufficient documentation

## 2020-02-11 NOTE — Patient Instructions (Signed)
Retrograde Massage: complete to dorsal hand and wrist daily to move fluid towards the body. Use left hand to gently press swollen areas and glide towards wrist and arm.   AROM Exercises: Complete exercises __10____ times each, ____2-3___ times per day  1) Wrist Flexion  Start with wrist at edge of table, palm facing up. With wrist hanging slightly off table, curl wrist upward, and back down.      2) Wrist Extension  Start with wrist at edge of table, palm facing down. With wrist slightly off the edge of the table, curl wrist up and back down.       3) WRIST PRONATION  Turn your forearm towards palm face down.  Keep your elbow bent and by the side of your  Body.      4) WRIST SUPINATION  Turn your forearm towards palm face up.  Keep your elbow bent and by the side of your  Body.
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
## 2020-02-15 ENCOUNTER — Ambulatory Visit (HOSPITAL_COMMUNITY): Payer: Self-pay

## 2020-02-15 ENCOUNTER — Other Ambulatory Visit: Payer: Self-pay

## 2020-02-15 ENCOUNTER — Encounter (HOSPITAL_COMMUNITY): Payer: Self-pay

## 2020-02-15 DIAGNOSIS — R29898 Other symptoms and signs involving the musculoskeletal system: Secondary | ICD-10-CM

## 2020-02-15 DIAGNOSIS — M25631 Stiffness of right wrist, not elsewhere classified: Secondary | ICD-10-CM

## 2020-02-15 DIAGNOSIS — M25641 Stiffness of right hand, not elsewhere classified: Secondary | ICD-10-CM

## 2020-02-15 DIAGNOSIS — M25531 Pain in right wrist: Secondary | ICD-10-CM

## 2020-02-15 DIAGNOSIS — M79641 Pain in right hand: Secondary | ICD-10-CM
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
## 2020-02-17 ENCOUNTER — Encounter (HOSPITAL_COMMUNITY): Payer: Self-pay

## 2020-02-17 ENCOUNTER — Ambulatory Visit (HOSPITAL_COMMUNITY): Payer: Self-pay

## 2020-02-17 ENCOUNTER — Other Ambulatory Visit: Payer: Self-pay

## 2020-02-17 DIAGNOSIS — R29898 Other symptoms and signs involving the musculoskeletal system: Secondary | ICD-10-CM

## 2020-02-17 DIAGNOSIS — M25531 Pain in right wrist: Secondary | ICD-10-CM

## 2020-02-17 DIAGNOSIS — M25631 Stiffness of right wrist, not elsewhere classified: Secondary | ICD-10-CM

## 2020-02-17 DIAGNOSIS — M25641 Stiffness of right hand, not elsewhere classified: Secondary | ICD-10-CM

## 2020-02-17 DIAGNOSIS — M79641 Pain in right hand: Secondary | ICD-10-CM
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
## 2020-02-23 ENCOUNTER — Encounter (HOSPITAL_COMMUNITY): Payer: Self-pay

## 2020-02-23 ENCOUNTER — Ambulatory Visit (HOSPITAL_COMMUNITY): Payer: Self-pay

## 2020-02-23 ENCOUNTER — Other Ambulatory Visit: Payer: Self-pay

## 2020-02-23 DIAGNOSIS — M79641 Pain in right hand: Secondary | ICD-10-CM

## 2020-02-23 DIAGNOSIS — M25531 Pain in right wrist: Secondary | ICD-10-CM

## 2020-02-23 DIAGNOSIS — M25631 Stiffness of right wrist, not elsewhere classified: Secondary | ICD-10-CM

## 2020-02-23 DIAGNOSIS — M25641 Stiffness of right hand, not elsewhere classified: Secondary | ICD-10-CM

## 2020-02-23 DIAGNOSIS — R29898 Other symptoms and signs involving the musculoskeletal system: Secondary | ICD-10-CM

## 2020-02-23 NOTE — Patient Instructions (Signed)
AROM: DIP Flexion / Extension   Pinch middle knuckle of __middle, ring, small______ finger of right hand to prevent bending. Bend end knuckle until stretch is felt. Hold ___2_ seconds. Relax. Straighten finger as far as possible. Repeat _10___ times per set. Do ____ sets per session. Do __2-3__ sessions per day.  Copyright  VHI. All rights reserved.    AROM: PIP Flexion / Extension   Pinch bottom knuckle of __middle, ring, small______ finger of right hand to prevent bending. Actively bend middle knuckle until stretch is felt. Hold __2__ seconds. Relax. Straighten finger as far as possible. Repeat _10___ times per set. Do ____ sets per session. Do __2-3__ sessions per day.  Copyright  VHI. All rights reserved.   AROM: Finger Flexion / Extension   Actively bend fingers of right hand. Start with knuckles furthest from palm, and slowly make a fist. Hold __2__ seconds. Relax. Then straighten fingers as far as possible. Repeat __10__ times per set. Do ____ sets per session. Do _2-3___ sessions per day.
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
## 2020-02-25 ENCOUNTER — Other Ambulatory Visit: Payer: Self-pay

## 2020-02-25 ENCOUNTER — Encounter (HOSPITAL_COMMUNITY): Payer: Self-pay

## 2020-02-25 ENCOUNTER — Ambulatory Visit (HOSPITAL_COMMUNITY): Payer: Self-pay

## 2020-02-25 DIAGNOSIS — M79641 Pain in right hand: Secondary | ICD-10-CM

## 2020-02-25 DIAGNOSIS — R29898 Other symptoms and signs involving the musculoskeletal system: Secondary | ICD-10-CM

## 2020-02-25 DIAGNOSIS — M25531 Pain in right wrist: Secondary | ICD-10-CM

## 2020-02-25 DIAGNOSIS — M25641 Stiffness of right hand, not elsewhere classified: Secondary | ICD-10-CM

## 2020-02-25 DIAGNOSIS — M25631 Stiffness of right wrist, not elsewhere classified: Secondary | ICD-10-CM
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
## 2020-03-01 ENCOUNTER — Ambulatory Visit (HOSPITAL_COMMUNITY): Payer: Self-pay | Admitting: Occupational Therapy

## 2020-03-01 ENCOUNTER — Encounter (HOSPITAL_COMMUNITY): Payer: Self-pay | Admitting: Occupational Therapy

## 2020-03-01 ENCOUNTER — Other Ambulatory Visit: Payer: Self-pay

## 2020-03-01 DIAGNOSIS — M25631 Stiffness of right wrist, not elsewhere classified: Secondary | ICD-10-CM

## 2020-03-01 DIAGNOSIS — M25531 Pain in right wrist: Secondary | ICD-10-CM

## 2020-03-01 DIAGNOSIS — M25641 Stiffness of right hand, not elsewhere classified: Secondary | ICD-10-CM

## 2020-03-01 DIAGNOSIS — M79641 Pain in right hand: Secondary | ICD-10-CM

## 2020-03-01 DIAGNOSIS — R29898 Other symptoms and signs involving the musculoskeletal system: Secondary | ICD-10-CM
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
## 2020-03-03 ENCOUNTER — Ambulatory Visit (HOSPITAL_COMMUNITY): Payer: Self-pay | Admitting: Occupational Therapy

## 2020-03-07 ENCOUNTER — Other Ambulatory Visit: Payer: Self-pay

## 2020-03-07 ENCOUNTER — Encounter (HOSPITAL_COMMUNITY): Payer: Self-pay

## 2020-03-07 ENCOUNTER — Ambulatory Visit (HOSPITAL_COMMUNITY): Payer: Self-pay

## 2020-03-07 DIAGNOSIS — M25641 Stiffness of right hand, not elsewhere classified: Secondary | ICD-10-CM

## 2020-03-07 DIAGNOSIS — R29898 Other symptoms and signs involving the musculoskeletal system: Secondary | ICD-10-CM

## 2020-03-07 DIAGNOSIS — M25531 Pain in right wrist: Secondary | ICD-10-CM

## 2020-03-07 DIAGNOSIS — M79641 Pain in right hand: Secondary | ICD-10-CM

## 2020-03-07 DIAGNOSIS — M25631 Stiffness of right wrist, not elsewhere classified: Secondary | ICD-10-CM

## 2020-03-07 NOTE — Patient Instructions (Signed)
Desensitization Techniques  Perform these exercises ever 2 hours for 15 minute sessions.  Progress to the next exercises when the exercises you are doing become easy.  1)  Using light pressure, rub the various textures along with the hypersensitive area:  A.  Flannel  E.  Polyester  B.  Velvet  F.  Corduroy  C.  Wool  G.  Cotton material  D.  Terry cloth  2)  With the same textures use a firmer pressure. 3)  Use a hand held vibrator and massage along the sensitive area. 4)  With a small dowel rod, eraser on a pencil or base of an ink pen tap along the sensitive area. 5)  Use an empty roll-on deodorant bottle to roll along the sensitive area. 6)  Place your hand/forearm in separate containers of the following items:  A.  Sand  D.  Dry lentil beans  B.  Dry Rice  E.  Dry kidney beans  C.  Ball bearings F.  Dry pinto beans  Scar Massage Purpose: To soften/smooth scar tissue.   To desensitize sensitive areas after surgery.   To mechanically break up inner scar tissue, adhesions, therefore allowing freer        movement of injured tendons and muscle.  Technique: Use a cream to massage with, as it insures a smooth gliding motion and avoids irritation    caused by rubbing skin to skin.  Cream is preferred over a lotion.   May begin as soon as any suture areas are healed.   Apply a firm, steady pressure with your finger-tip, pulling the skin in a circular motion over the   scarred area.  Do not rub.  Message 5-10 minutes, at least 3-5 times a day, unless you are getting tender afterwards
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
## 2020-03-09 ENCOUNTER — Telehealth (HOSPITAL_COMMUNITY): Payer: Self-pay

## 2020-03-09 ENCOUNTER — Ambulatory Visit (HOSPITAL_COMMUNITY): Payer: Self-pay

## 2020-03-09 NOTE — Telephone Encounter (Signed)
pt cancellled appt for today. He said he just can't make it in.

## 2020-03-14 ENCOUNTER — Other Ambulatory Visit: Payer: Self-pay

## 2020-03-14 ENCOUNTER — Ambulatory Visit (HOSPITAL_COMMUNITY): Payer: Self-pay | Attending: Physician Assistant

## 2020-03-14 ENCOUNTER — Encounter (HOSPITAL_COMMUNITY): Payer: Self-pay

## 2020-03-14 DIAGNOSIS — M25641 Stiffness of right hand, not elsewhere classified: Secondary | ICD-10-CM | POA: Insufficient documentation

## 2020-03-14 DIAGNOSIS — R29898 Other symptoms and signs involving the musculoskeletal system: Secondary | ICD-10-CM | POA: Insufficient documentation

## 2020-03-14 DIAGNOSIS — M79641 Pain in right hand: Secondary | ICD-10-CM | POA: Insufficient documentation

## 2020-03-14 DIAGNOSIS — M25631 Stiffness of right wrist, not elsewhere classified: Secondary | ICD-10-CM | POA: Insufficient documentation

## 2020-03-14 DIAGNOSIS — M25531 Pain in right wrist: Secondary | ICD-10-CM | POA: Insufficient documentation
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
## 2020-03-16 ENCOUNTER — Ambulatory Visit (HOSPITAL_COMMUNITY): Payer: Self-pay

## 2020-03-16 ENCOUNTER — Other Ambulatory Visit: Payer: Self-pay

## 2020-03-16 ENCOUNTER — Encounter (HOSPITAL_COMMUNITY): Payer: Self-pay

## 2020-03-16 DIAGNOSIS — M25631 Stiffness of right wrist, not elsewhere classified: Secondary | ICD-10-CM

## 2020-03-16 DIAGNOSIS — M25641 Stiffness of right hand, not elsewhere classified: Secondary | ICD-10-CM

## 2020-03-16 DIAGNOSIS — M25531 Pain in right wrist: Secondary | ICD-10-CM

## 2020-03-16 DIAGNOSIS — M79641 Pain in right hand: Secondary | ICD-10-CM

## 2020-03-16 DIAGNOSIS — R29898 Other symptoms and signs involving the musculoskeletal system: Secondary | ICD-10-CM
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
## 2020-03-21 ENCOUNTER — Encounter (HOSPITAL_COMMUNITY): Payer: Self-pay

## 2020-03-21 ENCOUNTER — Other Ambulatory Visit: Payer: Self-pay

## 2020-03-21 ENCOUNTER — Ambulatory Visit (HOSPITAL_COMMUNITY): Payer: Self-pay

## 2020-03-21 DIAGNOSIS — M79641 Pain in right hand: Secondary | ICD-10-CM

## 2020-03-21 DIAGNOSIS — R29898 Other symptoms and signs involving the musculoskeletal system: Secondary | ICD-10-CM

## 2020-03-21 DIAGNOSIS — M25531 Pain in right wrist: Secondary | ICD-10-CM

## 2020-03-21 DIAGNOSIS — M25631 Stiffness of right wrist, not elsewhere classified: Secondary | ICD-10-CM

## 2020-03-21 DIAGNOSIS — M25641 Stiffness of right hand, not elsewhere classified: Secondary | ICD-10-CM
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
## 2020-03-23 ENCOUNTER — Other Ambulatory Visit: Payer: Self-pay

## 2020-03-23 ENCOUNTER — Encounter (HOSPITAL_COMMUNITY): Payer: Self-pay

## 2020-03-23 ENCOUNTER — Ambulatory Visit (HOSPITAL_COMMUNITY): Payer: Self-pay

## 2020-03-23 DIAGNOSIS — M25531 Pain in right wrist: Secondary | ICD-10-CM

## 2020-03-23 DIAGNOSIS — M79641 Pain in right hand: Secondary | ICD-10-CM

## 2020-03-23 DIAGNOSIS — R29898 Other symptoms and signs involving the musculoskeletal system: Secondary | ICD-10-CM

## 2020-03-23 DIAGNOSIS — M25631 Stiffness of right wrist, not elsewhere classified: Secondary | ICD-10-CM

## 2020-03-23 DIAGNOSIS — M25641 Stiffness of right hand, not elsewhere classified: Secondary | ICD-10-CM
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
## 2020-03-28 ENCOUNTER — Ambulatory Visit (HOSPITAL_COMMUNITY): Payer: Self-pay

## 2020-03-30 ENCOUNTER — Encounter (HOSPITAL_COMMUNITY): Payer: Self-pay

## 2020-03-30 ENCOUNTER — Ambulatory Visit (HOSPITAL_COMMUNITY): Payer: Self-pay

## 2020-03-30 ENCOUNTER — Other Ambulatory Visit: Payer: Self-pay

## 2020-03-30 DIAGNOSIS — M79641 Pain in right hand: Secondary | ICD-10-CM

## 2020-03-30 DIAGNOSIS — M25641 Stiffness of right hand, not elsewhere classified: Secondary | ICD-10-CM

## 2020-03-30 DIAGNOSIS — R29898 Other symptoms and signs involving the musculoskeletal system: Secondary | ICD-10-CM

## 2020-03-30 DIAGNOSIS — M25631 Stiffness of right wrist, not elsewhere classified: Secondary | ICD-10-CM

## 2020-03-30 DIAGNOSIS — M25531 Pain in right wrist: Secondary | ICD-10-CM
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
## 2020-04-05 ENCOUNTER — Ambulatory Visit (HOSPITAL_COMMUNITY): Payer: Self-pay | Admitting: Occupational Therapy

## 2020-04-05 ENCOUNTER — Encounter (HOSPITAL_COMMUNITY): Payer: Self-pay | Admitting: Occupational Therapy

## 2020-04-05 ENCOUNTER — Other Ambulatory Visit: Payer: Self-pay

## 2020-04-05 DIAGNOSIS — M25641 Stiffness of right hand, not elsewhere classified: Secondary | ICD-10-CM

## 2020-04-05 DIAGNOSIS — R29898 Other symptoms and signs involving the musculoskeletal system: Secondary | ICD-10-CM

## 2020-04-05 DIAGNOSIS — M79641 Pain in right hand: Secondary | ICD-10-CM

## 2020-04-05 DIAGNOSIS — M25631 Stiffness of right wrist, not elsewhere classified: Secondary | ICD-10-CM
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
## 2020-04-08 ENCOUNTER — Ambulatory Visit (HOSPITAL_COMMUNITY): Payer: Self-pay | Admitting: Occupational Therapy

## 2020-04-11 ENCOUNTER — Telehealth (HOSPITAL_COMMUNITY): Payer: Self-pay

## 2020-04-11 ENCOUNTER — Ambulatory Visit (HOSPITAL_COMMUNITY): Payer: Self-pay
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
## 2020-04-13 ENCOUNTER — Other Ambulatory Visit: Payer: Self-pay

## 2020-04-13 ENCOUNTER — Encounter (HOSPITAL_COMMUNITY): Payer: Self-pay

## 2020-04-13 ENCOUNTER — Ambulatory Visit (HOSPITAL_COMMUNITY): Payer: Self-pay | Attending: Physician Assistant

## 2020-04-13 DIAGNOSIS — M79641 Pain in right hand: Secondary | ICD-10-CM | POA: Insufficient documentation

## 2020-04-13 DIAGNOSIS — M25531 Pain in right wrist: Secondary | ICD-10-CM | POA: Insufficient documentation

## 2020-04-13 DIAGNOSIS — R29898 Other symptoms and signs involving the musculoskeletal system: Secondary | ICD-10-CM | POA: Insufficient documentation

## 2020-04-13 DIAGNOSIS — M25641 Stiffness of right hand, not elsewhere classified: Secondary | ICD-10-CM | POA: Insufficient documentation

## 2020-04-13 DIAGNOSIS — M25631 Stiffness of right wrist, not elsewhere classified: Secondary | ICD-10-CM | POA: Insufficient documentation
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
## 2020-04-13 NOTE — Patient Instructions (Signed)
  Coordination Activities  Perform the following activities for 15-20 minutes 1-2 times per day with right hand(s).   Rotate ball in fingertips (clockwise and counter-clockwise).  Flip cards 1 at a time as fast as you can.  Deal cards with your thumb (Hold deck in hand and push card off top with thumb).  Rotate card in hand (clockwise and counter-clockwise).  Pick up coins and stack.  Pick up coins one at a time until you get 5-10 in your hand, then move coins from palm to fingertips to stack one at a time.  Twirl pen between fingers.  Practice writing and/or typing.

## 2020-04-18 ENCOUNTER — Telehealth (HOSPITAL_COMMUNITY): Payer: Self-pay

## 2020-04-18 ENCOUNTER — Ambulatory Visit (HOSPITAL_COMMUNITY): Payer: Self-pay

## 2020-04-18 NOTE — Telephone Encounter (Signed)
Called and left message regarding no show. Reminded patient of next appointment to call if unable to make it.  Limmie Patricia, OTR/L,CBIS  [phone]

## 2020-04-20 ENCOUNTER — Other Ambulatory Visit: Payer: Self-pay

## 2020-04-20 ENCOUNTER — Encounter (HOSPITAL_COMMUNITY): Payer: Self-pay

## 2020-04-20 ENCOUNTER — Ambulatory Visit (HOSPITAL_COMMUNITY): Payer: Self-pay

## 2020-04-20 DIAGNOSIS — M25641 Stiffness of right hand, not elsewhere classified: Secondary | ICD-10-CM

## 2020-04-20 DIAGNOSIS — M25531 Pain in right wrist: Secondary | ICD-10-CM

## 2020-04-20 DIAGNOSIS — M79641 Pain in right hand: Secondary | ICD-10-CM

## 2020-04-20 DIAGNOSIS — R29898 Other symptoms and signs involving the musculoskeletal system: Secondary | ICD-10-CM

## 2020-04-20 DIAGNOSIS — M25631 Stiffness of right wrist, not elsewhere classified: Secondary | ICD-10-CM

## 2020-04-20 NOTE — Patient Instructions (Signed)
Home Exercises Program Theraputty Exercises  Do the following exercises 2-3 times a day using your affected hand.  1. Roll putty into a ball.  2. Make into a pancake.  3. Roll putty into a roll.  4. Pinch along log with first finger and thumb.   5. Make into a ball.  6. Roll it back into a log.   7. Pinch using thumb and side of first finger.  8. Roll into a ball, then flatten into a pancake.  9. Using your fingers, make putty into a mountain.  10. Roll putty into a ball. Squeeze and release 10 times.
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
## 2020-04-25 ENCOUNTER — Ambulatory Visit (HOSPITAL_COMMUNITY): Payer: Self-pay | Admitting: Occupational Therapy

## 2020-04-26 ENCOUNTER — Ambulatory Visit (HOSPITAL_COMMUNITY): Payer: Self-pay | Admitting: Occupational Therapy

## 2020-05-03 ENCOUNTER — Telehealth (HOSPITAL_COMMUNITY): Payer: Self-pay | Admitting: Occupational Therapy

## 2020-05-03 ENCOUNTER — Encounter (HOSPITAL_COMMUNITY): Payer: Self-pay | Admitting: Occupational Therapy

## 2020-05-03 NOTE — Telephone Encounter (Signed)
pt called to cx this appt due to he is out of town.

## 2020-05-10 ENCOUNTER — Encounter (HOSPITAL_COMMUNITY): Payer: Self-pay | Admitting: Occupational Therapy

## 2020-05-10 ENCOUNTER — Telehealth (HOSPITAL_COMMUNITY): Payer: Self-pay | Admitting: Occupational Therapy

## 2020-05-10 NOTE — Telephone Encounter (Signed)
Left message for pt regarding no-show today and last week. Reminded pt of attendance policy and informed him that all appts have been cancelled except for 05/11/20 at 1:00. Asked pt to call if unable to make it, and informed that if he does not call and does not attend appt he will be discharged.    Ezra Sites, OTR/L  [phone] 05/10/20

## 2020-05-11 ENCOUNTER — Ambulatory Visit (HOSPITAL_COMMUNITY): Payer: Self-pay | Attending: Physician Assistant | Admitting: Occupational Therapy

## 2020-05-11 ENCOUNTER — Other Ambulatory Visit: Payer: Self-pay

## 2020-05-11 ENCOUNTER — Encounter (HOSPITAL_COMMUNITY): Payer: Self-pay | Admitting: Occupational Therapy

## 2020-05-11 DIAGNOSIS — M25641 Stiffness of right hand, not elsewhere classified: Secondary | ICD-10-CM | POA: Insufficient documentation

## 2020-05-11 DIAGNOSIS — R29898 Other symptoms and signs involving the musculoskeletal system: Secondary | ICD-10-CM | POA: Insufficient documentation

## 2020-05-11 DIAGNOSIS — M79641 Pain in right hand: Secondary | ICD-10-CM | POA: Insufficient documentation

## 2020-05-11 DIAGNOSIS — M25631 Stiffness of right wrist, not elsewhere classified: Secondary | ICD-10-CM | POA: Insufficient documentation

## 2020-05-11 DIAGNOSIS — M25531 Pain in right wrist: Secondary | ICD-10-CM | POA: Insufficient documentation
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
## 2020-05-17 ENCOUNTER — Encounter (HOSPITAL_COMMUNITY): Payer: Self-pay | Admitting: Occupational Therapy

## 2020-05-19 ENCOUNTER — Encounter (HOSPITAL_COMMUNITY): Payer: Self-pay | Admitting: Occupational Therapy

## 2020-05-19 ENCOUNTER — Other Ambulatory Visit: Payer: Self-pay

## 2020-05-19 ENCOUNTER — Ambulatory Visit (HOSPITAL_COMMUNITY): Payer: Self-pay | Admitting: Occupational Therapy

## 2020-05-19 DIAGNOSIS — M25631 Stiffness of right wrist, not elsewhere classified: Secondary | ICD-10-CM

## 2020-05-19 DIAGNOSIS — M79641 Pain in right hand: Secondary | ICD-10-CM

## 2020-05-19 DIAGNOSIS — M25531 Pain in right wrist: Secondary | ICD-10-CM

## 2020-05-19 DIAGNOSIS — R29898 Other symptoms and signs involving the musculoskeletal system: Secondary | ICD-10-CM

## 2020-05-19 DIAGNOSIS — M25641 Stiffness of right hand, not elsewhere classified: Secondary | ICD-10-CM

## 2020-05-19 NOTE — Patient Instructions (Signed)
AROM Exercises   1) Wrist Flexion  Start with wrist at edge of table, palm facing up. With wrist hanging slightly off table, curl wrist upward, and back down.      2) Wrist Extension  Start with wrist at edge of table, palm facing down. With wrist slightly off the edge of the table, curl wrist up and back down.      3) Radial Deviations  Start with forearm flat against a table, wrist hanging slightly off the edge, and palm facing the wall. Bending at the wrist only, and keeping palm facing the wall, bend wrist so fist is pointing towards the floor, back up to start position, and up towards the ceiling. Return to start.        4) WRIST PRONATION  Turn your forearm towards palm face down.  Keep your elbow bent and by the side of your  Body.      5) WRIST SUPINATION  Turn your forearm towards palm face up.  Keep your elbow bent and by the side of your  Body.      *Complete exercises ______ times each, _______ times per day*      Strengthening Exercises  1) WRIST EXTENSION CURLS - TABLE  Hold a small free weight, rest your forearm on a table and bend your wrist up and down with your palm face down as shown.      2) WRIST FLEXION CURLS - TABLE  Hold a small free weight, rest your forearm on a table and bend your wrist up and down with your palm face up as shown.     3) FREE WEIGHT RADIAL/ULNAR DEVIATION - TABLE  Hold a small free weight, rest your forearm on a table and bend your wrist up and down with your palm facing towards the side as shown.     4) Pronation  Forearm supported on table with wrist in neutral position. Using a weight, roll wrist so that palm faces downward. Hold for 2 seconds and return to starting position.     5) Supination  Forearm supported on table with wrist in neutral position. Using a weight, roll wrist so that palm is now facing upward. Hold for 2 seconds and return to starting position.      *Complete  exercises using ____ pound weight, ____times each, ____times per day*  
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
## 2020-05-20 ENCOUNTER — Encounter (HOSPITAL_COMMUNITY): Payer: Self-pay | Admitting: Occupational Therapy

## 2020-05-20 ENCOUNTER — Ambulatory Visit (HOSPITAL_COMMUNITY): Payer: Self-pay | Admitting: Occupational Therapy

## 2020-05-20 DIAGNOSIS — R29898 Other symptoms and signs involving the musculoskeletal system: Secondary | ICD-10-CM

## 2020-05-20 DIAGNOSIS — M25631 Stiffness of right wrist, not elsewhere classified: Secondary | ICD-10-CM

## 2020-05-20 DIAGNOSIS — M25531 Pain in right wrist: Secondary | ICD-10-CM

## 2020-05-20 DIAGNOSIS — M25641 Stiffness of right hand, not elsewhere classified: Secondary | ICD-10-CM

## 2020-05-20 DIAGNOSIS — M79641 Pain in right hand: Secondary | ICD-10-CM
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
## 2020-05-23 ENCOUNTER — Ambulatory Visit (HOSPITAL_COMMUNITY): Payer: Self-pay | Admitting: Occupational Therapy

## 2020-05-24 ENCOUNTER — Encounter (HOSPITAL_COMMUNITY): Payer: Self-pay | Admitting: Occupational Therapy

## 2020-05-26 ENCOUNTER — Encounter (HOSPITAL_COMMUNITY): Payer: Self-pay | Admitting: Specialist

## 2020-05-27 ENCOUNTER — Other Ambulatory Visit: Payer: Self-pay

## 2020-05-27 ENCOUNTER — Encounter (HOSPITAL_COMMUNITY): Payer: Self-pay | Admitting: Occupational Therapy

## 2020-05-27 ENCOUNTER — Ambulatory Visit (HOSPITAL_COMMUNITY): Payer: Self-pay | Admitting: Occupational Therapy

## 2020-05-27 DIAGNOSIS — M25531 Pain in right wrist: Secondary | ICD-10-CM

## 2020-05-27 DIAGNOSIS — M25631 Stiffness of right wrist, not elsewhere classified: Secondary | ICD-10-CM

## 2020-05-27 DIAGNOSIS — M79641 Pain in right hand: Secondary | ICD-10-CM

## 2020-05-27 DIAGNOSIS — R29898 Other symptoms and signs involving the musculoskeletal system: Secondary | ICD-10-CM

## 2020-05-27 DIAGNOSIS — M25641 Stiffness of right hand, not elsewhere classified: Secondary | ICD-10-CM
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
## 2020-05-31 ENCOUNTER — Encounter (HOSPITAL_COMMUNITY): Payer: Self-pay | Admitting: Specialist

## 2020-06-02 ENCOUNTER — Encounter (HOSPITAL_COMMUNITY): Payer: Self-pay | Admitting: Occupational Therapy

## 2020-06-03 ENCOUNTER — Ambulatory Visit (HOSPITAL_COMMUNITY): Payer: Self-pay | Admitting: Occupational Therapy

## 2020-06-03 ENCOUNTER — Telehealth (HOSPITAL_COMMUNITY): Payer: Self-pay | Admitting: Occupational Therapy

## 2020-06-03 NOTE — Telephone Encounter (Signed)
pt called to cx today's appt due to he has to watch his daughter

## 2020-06-06 ENCOUNTER — Ambulatory Visit (HOSPITAL_COMMUNITY): Payer: Self-pay | Admitting: Occupational Therapy

## 2020-06-06 ENCOUNTER — Encounter (HOSPITAL_COMMUNITY): Payer: Self-pay | Admitting: Occupational Therapy

## 2020-06-06 ENCOUNTER — Other Ambulatory Visit: Payer: Self-pay

## 2020-06-06 DIAGNOSIS — R29898 Other symptoms and signs involving the musculoskeletal system: Secondary | ICD-10-CM

## 2020-06-06 DIAGNOSIS — M25641 Stiffness of right hand, not elsewhere classified: Secondary | ICD-10-CM

## 2020-06-06 DIAGNOSIS — M79641 Pain in right hand: Secondary | ICD-10-CM

## 2020-06-06 DIAGNOSIS — M25531 Pain in right wrist: Secondary | ICD-10-CM

## 2020-06-06 DIAGNOSIS — M25631 Stiffness of right wrist, not elsewhere classified: Secondary | ICD-10-CM
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
## 2020-06-07 ENCOUNTER — Encounter (HOSPITAL_COMMUNITY): Payer: Self-pay | Admitting: Occupational Therapy

## 2020-06-09 ENCOUNTER — Encounter (HOSPITAL_COMMUNITY): Payer: Self-pay | Admitting: Occupational Therapy

## 2020-06-09 ENCOUNTER — Ambulatory Visit (HOSPITAL_COMMUNITY): Payer: Self-pay | Attending: Physician Assistant | Admitting: Specialist

## 2020-06-09 DIAGNOSIS — R29898 Other symptoms and signs involving the musculoskeletal system: Secondary | ICD-10-CM | POA: Insufficient documentation

## 2020-06-09 DIAGNOSIS — M25631 Stiffness of right wrist, not elsewhere classified: Secondary | ICD-10-CM | POA: Insufficient documentation

## 2020-06-09 DIAGNOSIS — M79641 Pain in right hand: Secondary | ICD-10-CM | POA: Insufficient documentation

## 2020-06-09 DIAGNOSIS — M25531 Pain in right wrist: Secondary | ICD-10-CM | POA: Insufficient documentation

## 2020-06-09 DIAGNOSIS — M25641 Stiffness of right hand, not elsewhere classified: Secondary | ICD-10-CM | POA: Insufficient documentation

## 2020-06-14 ENCOUNTER — Encounter (HOSPITAL_COMMUNITY): Payer: Self-pay | Admitting: Occupational Therapy

## 2020-06-14 ENCOUNTER — Other Ambulatory Visit: Payer: Self-pay

## 2020-06-14 ENCOUNTER — Ambulatory Visit (HOSPITAL_COMMUNITY): Payer: Self-pay | Admitting: Occupational Therapy

## 2020-06-14 DIAGNOSIS — M25631 Stiffness of right wrist, not elsewhere classified: Secondary | ICD-10-CM

## 2020-06-14 DIAGNOSIS — M25641 Stiffness of right hand, not elsewhere classified: Secondary | ICD-10-CM

## 2020-06-14 DIAGNOSIS — M25531 Pain in right wrist: Secondary | ICD-10-CM

## 2020-06-14 DIAGNOSIS — M79641 Pain in right hand: Secondary | ICD-10-CM

## 2020-06-14 DIAGNOSIS — R29898 Other symptoms and signs involving the musculoskeletal system: Secondary | ICD-10-CM

## 2020-06-14 NOTE — Patient Instructions (Signed)
Fine Motor Coordination Exercises  **Perform the following checked exercises __2__ times a day, as recommended  by your occupational therapist. ? Close all fingers and thumb into a tight fist and then open wide (10 times). ? Palm of hand on table, spread fingers apart, then together (10 times) ? Lift fingers and thumb off table one at a time. Increase speed as able (10 times). ? Touch thumb to each fingertip. Increase speed as able (10 times). ? Thumb circles (10 times). ? Pick up 5 small objects (coins, marbles, paperclips, beads, etc) one at a time and hold  them in hand, then place them one by one onto the table. ? Pick up small objects and place them into a cup or container. ? Thread buttons or beads onto a string. ? Place clothespins onto the edge of a cup, can, or container. ? Play card games. Practice shuffling and dealing cards. Flip cards over onto table one  by one. ? Practice screwing and unscrewing nuts/bolts. ? [employer] (checkers, coins, etc) onto table. ? Use scissors to cut paper. ? Practice writing skills, dot-to-dot, puzzles, etc. ? Paperwork: practice folding paper, stuffing envelopes, addressing envelopes, stapling,  using paperclips, and tape. ? Practice lacing and tying a shoelace. ? With tweezers, pick up small objects and put into a small container. Try sorting beads  or buttons.
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
## 2020-06-16 ENCOUNTER — Encounter (HOSPITAL_COMMUNITY): Payer: Self-pay | Admitting: Occupational Therapy

## 2020-06-17 ENCOUNTER — Encounter (HOSPITAL_COMMUNITY): Payer: Self-pay | Admitting: Occupational Therapy

## 2020-06-20 ENCOUNTER — Encounter (HOSPITAL_COMMUNITY): Payer: Self-pay | Admitting: Occupational Therapy

## 2020-06-23 ENCOUNTER — Encounter (HOSPITAL_COMMUNITY): Payer: Self-pay | Admitting: Occupational Therapy

## 2020-06-27 ENCOUNTER — Encounter (HOSPITAL_COMMUNITY): Payer: Self-pay | Admitting: Occupational Therapy

## 2020-06-29 ENCOUNTER — Encounter (HOSPITAL_COMMUNITY): Payer: Self-pay | Admitting: Occupational Therapy

## 2020-07-04 ENCOUNTER — Encounter (HOSPITAL_COMMUNITY): Payer: Self-pay | Admitting: Occupational Therapy

## 2020-07-06 ENCOUNTER — Encounter (HOSPITAL_COMMUNITY): Payer: Self-pay | Admitting: Occupational Therapy

## 2020-07-12 ENCOUNTER — Encounter (HOSPITAL_COMMUNITY): Payer: Self-pay | Admitting: Occupational Therapy

## 2020-07-14 ENCOUNTER — Encounter (HOSPITAL_COMMUNITY): Payer: Self-pay | Admitting: Occupational Therapy

## 2020-09-24 ENCOUNTER — Emergency Department (HOSPITAL_COMMUNITY): Payer: Medicaid Other

## 2020-09-24 ENCOUNTER — Emergency Department (HOSPITAL_COMMUNITY)
Admission: EM | Admit: 2020-09-24 | Discharge: 2020-10-10 | Disposition: E | Payer: Medicaid Other | Attending: Emergency Medicine | Admitting: Emergency Medicine

## 2020-09-24 DIAGNOSIS — I468 Cardiac arrest due to other underlying condition: Secondary | ICD-10-CM

## 2020-09-24 DIAGNOSIS — R55 Syncope and collapse: Secondary | ICD-10-CM | POA: Insufficient documentation

## 2020-09-24 DIAGNOSIS — I469 Cardiac arrest, cause unspecified: Secondary | ICD-10-CM | POA: Insufficient documentation

## 2020-09-24 LAB — COMPREHENSIVE METABOLIC PANEL
ALT: 80 U/L — ABNORMAL HIGH (ref 0–44)
AST: 109 U/L — ABNORMAL HIGH (ref 15–41)
Albumin: 3.2 g/dL — ABNORMAL LOW (ref 3.5–5.0)
Alkaline Phosphatase: 50 U/L (ref 38–126)
Anion gap: 17 — ABNORMAL HIGH (ref 5–15)
BUN: 5 mg/dL — ABNORMAL LOW (ref 6–20)
CO2: 19 mmol/L — ABNORMAL LOW (ref 22–32)
Calcium: 7.9 mg/dL — ABNORMAL LOW (ref 8.9–10.3)
Chloride: 106 mmol/L (ref 98–111)
Creatinine, Ser: 1.53 mg/dL — ABNORMAL HIGH (ref 0.61–1.24)
GFR, Estimated: >60 mL/min (ref >60)
Glucose, Bld: 327 mg/dL — ABNORMAL HIGH (ref 70–99)
Potassium: 2.9 mmol/L — ABNORMAL LOW (ref 3.5–5.1)
Sodium: 142 mmol/L (ref 135–145)
Total Bilirubin: 0.2 mg/dL — ABNORMAL LOW (ref 0.3–1.2)
Total Protein: 5.3 g/dL — ABNORMAL LOW (ref 6.5–8.1)

## 2020-09-24 LAB — CBC
HCT: 41.6 % (ref 39.0–52.0)
Hemoglobin: 13.2 g/dL (ref 13.0–17.0)
MCH: 33.2 pg (ref 26.0–34.0)
MCHC: 31.7 g/dL (ref 30.0–36.0)
MCV: 104.5 fL — ABNORMAL HIGH (ref 80.0–100.0)
Platelets: 132 10*3/uL — ABNORMAL LOW (ref 150–400)
RBC: 3.98 MIL/uL — ABNORMAL LOW (ref 4.22–5.81)
RDW: 14 % (ref 11.5–15.5)
WBC: 4.8 10*3/uL (ref 4.0–10.5)
nRBC: 0.4 % — ABNORMAL HIGH (ref 0.0–0.2)

## 2020-09-24 LAB — I-STAT CHEM 8, ED
BUN: 5 mg/dL — ABNORMAL LOW (ref 6–20)
Calcium, Ion: 1.03 mmol/L — ABNORMAL LOW (ref 1.15–1.40)
Chloride: 104 mmol/L (ref 98–111)
Creatinine, Ser: 1.7 mg/dL — ABNORMAL HIGH (ref 0.61–1.24)
Glucose, Bld: 313 mg/dL — ABNORMAL HIGH (ref 70–99)
HCT: 41 % (ref 39.0–52.0)
Hemoglobin: 13.9 g/dL (ref 13.0–17.0)
Potassium: 2.8 mmol/L — ABNORMAL LOW (ref 3.5–5.1)
Sodium: 144 mmol/L (ref 135–145)
TCO2: 19 mmol/L — ABNORMAL LOW (ref 22–32)

## 2020-09-24 LAB — ETHANOL: Alcohol, Ethyl (B): 273 mg/dL — ABNORMAL HIGH (ref <10)

## 2020-09-24 LAB — LACTIC ACID, PLASMA: Lactic Acid, Venous: 10.5 mmol/L (ref 0.5–1.9)

## 2020-09-24 LAB — PROTIME-INR
INR: 1.3 — ABNORMAL HIGH (ref 0.8–1.2)
Prothrombin Time: 15.5 seconds — ABNORMAL HIGH (ref 11.4–15.2)

## 2020-09-24 MED FILL — Medication: Qty: 1 | Status: AC

## 2020-09-25 LAB — BPAM FFP
Blood Product Expiration Date: 202110202359
Blood Product Expiration Date: 202110202359
ISSUE DATE / TIME: 202110160225
ISSUE DATE / TIME: 202110160225
Unit Type and Rh: 6200
Unit Type and Rh: 6200

## 2020-09-25 LAB — PREPARE FRESH FROZEN PLASMA
Unit division: 0
Unit division: 0

## 2020-09-25 LAB — TYPE AND SCREEN
ABO/RH(D): O POS
Antibody Screen: NEGATIVE
Unit division: 0
Unit division: 0
Unit division: 0
Unit division: 0

## 2020-09-25 LAB — BPAM RBC
Blood Product Expiration Date: 202111192359
Blood Product Expiration Date: 202111192359
Blood Product Expiration Date: 202111192359
Blood Product Expiration Date: 202111192359
ISSUE DATE / TIME: 202110160225
ISSUE DATE / TIME: 202110160225
ISSUE DATE / TIME: 202110160311
ISSUE DATE / TIME: 202110160311
Unit Type and Rh: 5100
Unit Type and Rh: 5100
Unit Type and Rh: 5100
Unit Type and Rh: 5100

## 2020-10-10 NOTE — ED Notes (Signed)
Dr Fredricka Bonine placing a chest tube lt chest
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
## 2020-10-10 NOTE — Progress Notes (Addendum)
   2020-10-23 0220  Clinical Encounter Type  Visited With Patient not available (patient being treated by healthcare team)  Visit Type Trauma  Referral From Nurse  Consult/Referral To Chaplain  Chaplain responded to level 1 page. Chaplain checked in with Unit Secretary, Verlon Au. Patient being attended to by medical team, no family present. Advised Unit Secretary to call Chaplain if family arrives and needs Chaplain support.This note was prepared by Deneen Harts, M.Div..  For questions please contact by phone [phone].

## 2020-10-10 NOTE — ED Notes (Signed)
2nd unit of blood ubit  [id number] hung wo [place] rn

## 2020-10-10 NOTE — ED Notes (Signed)
epin bristojet iv per Shanda Bumps rn

## 2020-10-10 NOTE — ED Notes (Signed)
Blood unit num ber one 0 neg wide open   Unit [id number]
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
## 2020-10-10 NOTE — ED Notes (Signed)
3rd unit of packed cells  emedrgency w 2300 21 [id number]

## 2020-10-10 NOTE — ED Notes (Signed)
Levophed drip going a t

## 2020-10-10 NOTE — ED Notes (Signed)
epine bristojet iv Shanda Bumps rn
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
## 2020-10-10 NOTE — ED Notes (Signed)
1st unit complete bl;ood
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
## 2020-10-10 NOTE — ED Notes (Signed)
18  A ngiocath lt a-r Shanda Bumps rn iv

## 2020-10-10 NOTE — ED Notes (Signed)
Pronounced 0318 by dr Aleene Davidson

## 2020-10-10 NOTE — ED Notes (Signed)
Minimal blood coming through chest tubes
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
## 2020-10-10 NOTE — ED Notes (Signed)
2nd ffp infused.

## 2020-10-10 NOTE — ED Notes (Signed)
3nd chest tube rt chest by dr Aleene Davidson

## 2020-10-10 NOTE — ED Notes (Signed)
cpr stopped [employer]

## 2020-10-10 NOTE — ED Notes (Signed)
1st ffp infused 2nd unit added to iv [id number]
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
## 2020-10-10 NOTE — ED Notes (Signed)
epine bo;us iv Shanda Bumps rn
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
## 2020-10-10 NOTE — ED Notes (Signed)
cpr started again 

## 2020-10-10 NOTE — ED Notes (Signed)
ffp  Unit w 2399 21 10 10 23  5

## 2020-10-10 DEATH — deceased
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
